# Patient Record
Sex: Male | Born: 1935 | Race: White | Hispanic: No | Marital: Married | Smoking: Never smoker
Health system: Southern US, Community
[De-identification: ages and names within clinical notes are randomized; demographics above are authoritative.]

## PROBLEM LIST (undated history)

## (undated) DIAGNOSIS — C4490 Unspecified malignant neoplasm of skin, unspecified: Secondary | ICD-10-CM

## (undated) DIAGNOSIS — I10 Essential (primary) hypertension: Secondary | ICD-10-CM

## (undated) HISTORY — DX: Unspecified malignant neoplasm of skin, unspecified: C44.90

## (undated) HISTORY — DX: Essential (primary) hypertension: I10

---

## 2016-06-22 DIAGNOSIS — B078 Other viral warts: Secondary | ICD-10-CM | POA: Diagnosis not present

## 2016-08-02 ENCOUNTER — Encounter: Payer: Self-pay | Admitting: Family Medicine

## 2016-08-02 ENCOUNTER — Ambulatory Visit (INDEPENDENT_AMBULATORY_CARE_PROVIDER_SITE_OTHER): Payer: Medicare HMO | Admitting: Family Medicine

## 2016-08-02 ENCOUNTER — Telehealth: Payer: Self-pay | Admitting: Family Medicine

## 2016-08-02 DIAGNOSIS — I10 Essential (primary) hypertension: Secondary | ICD-10-CM | POA: Diagnosis not present

## 2016-08-02 LAB — BASIC METABOLIC PANEL
BUN: 17 mg/dL (ref 6–23)
CALCIUM: 9.6 mg/dL (ref 8.4–10.5)
CO2: 28 meq/L (ref 19–32)
CREATININE: 1.01 mg/dL (ref 0.40–1.50)
Chloride: 102 mEq/L (ref 96–112)
GFR: 75.63 mL/min (ref >60.00)
Glucose, Bld: 87 mg/dL (ref 70–99)
Potassium: 4.5 mEq/L (ref 3.5–5.1)
Sodium: 136 mEq/L (ref 135–145)

## 2016-08-02 MED ORDER — OLMESARTAN MEDOXOMIL 20 MG PO TABS: 20.0000 mg | ORAL_TABLET | Freq: Every day | ORAL | 2 refills | Status: DC

## 2016-08-02 NOTE — Telephone Encounter (Signed)
° ° °  Pt ask if the below med can be written for 40mg  instead of 20mg  because he splits them in half   [place]

## 2016-08-02 NOTE — Progress Notes (Signed)
HPI:   Francis Park is a 81 y.o. male, who is here today with his wife to establish care.  Former PCP: Dr Twana First in [facility]. He jus move to [facility]. Last preventive routine visit: 12/2015.  Chronic medical problems: HTN and skin cancer (melanoma,BCC,and SCC) He is already established with dermatologists here in town and following every 6 months.    He lives with his wife. Independent ADL's and IADL's. No falls in the past year and denies depression symptoms.    Hypertension:   Dx around 1998.  Currently on Benicar 20 mg daily.   Last eye exam 01/2016. He is taking medications as instructed, no side effects reported.  He has not noted unusual headache, visual changes, exertional chest pain, dyspnea,  focal weakness, or edema.   Review of Systems  Constitutional: Negative for activity change, appetite change, fatigue, fever and unexpected weight change.  HENT: Negative for mouth sores, nosebleeds, sore throat and trouble swallowing.   Eyes: Negative for redness and visual disturbance.  Respiratory: Negative for apnea, cough, shortness of breath and wheezing.   Cardiovascular: Negative for chest pain, palpitations and leg swelling.  Gastrointestinal: Negative for abdominal pain, nausea and vomiting.  Endocrine: Negative for cold intolerance and heat intolerance.  Genitourinary: Negative for decreased urine volume, dysuria and hematuria.  Musculoskeletal: Negative for gait problem and myalgias.  Neurological: Negative for dizziness, syncope, weakness and headaches.  Psychiatric/Behavioral: Negative for confusion and sleep disturbance. The patient is not nervous/anxious.       No current outpatient prescriptions on file prior to visit.   No current facility-administered medications on file prior to visit.      Past Medical History:  Diagnosis Date  . Hypertension   . Skin cancer    Melanoma in situ, BCC,and SCC. Follows with dermatologists every 6  months.   No Known Allergies  Family History  Problem Relation Age of Onset  . Heart disease Father        CAD  . Hypertension Father   . Cancer Neg Hx     Social History   Social History  . Marital status: Married    Spouse name: N/A  . Number of children: N/A  . Years of education: N/A   Social History Main Topics  . Smoking status: Never Smoker  . Smokeless tobacco: Never Used  . Alcohol use No  . Drug use: No  . Sexual activity: Not Asked   Other Topics Concern  . None   Social History Narrative  . None    Vitals:   08/02/16 1404  BP: 138/80  Pulse: 85  Resp: 12   O2 sat at RA 97% Body mass index is 25.07 kg/m.  Physical Exam  Nursing note and vitals reviewed. Constitutional: He is oriented to person, place, and time. He appears well-developed and well-nourished. No distress.  HENT:  Head: Atraumatic.  Mouth/Throat: Oropharynx is clear and moist and mucous membranes are normal.  Eyes: Pupils are equal, round, and reactive to light. Conjunctivae and EOM are normal.  Neck: No tracheal deviation present. No thyroid mass and no thyromegaly present.  Cardiovascular: Normal rate and regular rhythm.   Occasional extrasystoles (X 2 in a min) are present.  No murmur heard. Pulses:      Dorsalis pedis pulses are 2+ on the right side, and 2+ on the left side.  Varicose vein LE, bilateral.   Respiratory: Effort normal and breath sounds normal. No respiratory distress.  GI:  Soft. He exhibits no mass. There is no hepatomegaly. There is no tenderness.  Musculoskeletal: He exhibits no edema.  Lymphadenopathy:    He has no cervical adenopathy.  Neurological: He is alert and oriented to person, place, and time. He has normal strength. Coordination and gait normal.  Skin: Skin is warm. No rash noted. No erythema.  Psychiatric: He has a normal mood and affect. Cognition and memory are normal.  Well groomed, good eye contact.    ASSESSMENT AND PLAN:   Mr.  Unknown Park was seen today for establish care.  Diagnoses and all orders for this visit:  Lab Results  Component Value Date   CREATININE 1.01 08/02/2016   BUN 17 08/02/2016   NA 136 08/02/2016   K 4.5 08/02/2016   CL 102 08/02/2016   CO2 28 08/02/2016    Hypertension, essential, benign  Adequately controlled. No changes in current management. DASH-low salt diet recommended. Eye exam recommended annually. F/U in 6 months, before if needed.   -     Basic metabolic panel -     olmesartan (BENICAR) 40 MG tablet; Take 0.5 tablets (20 mg total) by mouth daily.   He called later requesting changing Benicar to 40 mg to take 1/2 tab. Rx changed and sent.     Betty G. Martinique, MD  [facility] [place] office.

## 2016-08-02 NOTE — Patient Instructions (Addendum)
A few things to remember from today's visit:   Hypertension, essential, benign - Plan: olmesartan (BENICAR) 20 MG tablet, Basic metabolic panel  Blood pressure goal for most people is less than 140/90. Some populations (older than 60) the goal is less than 150/90.  Most recent cardiologists' recommendations recommend blood pressure at or less than 130/80.   Elevated blood pressure increases the risk of strokes, heart and kidney disease, and eye problems. Regular physical activity and a healthy diet (DASH diet) usually help. Low salt diet. Take medications as instructed.  Caution with some over the counter medications as cold medications, dietary products (for weight loss), and Ibuprofen or Aleve (frequent use);all these medications could cause elevation of blood pressure.   Please be sure medication list is accurate. If a new problem present, please set up appointment sooner than planned today.

## 2016-08-02 NOTE — Telephone Encounter (Signed)
Please advise regarding the Benicar.    Thanks!

## 2016-08-04 ENCOUNTER — Encounter: Payer: Self-pay | Admitting: Family Medicine

## 2016-08-04 MED ORDER — OLMESARTAN MEDOXOMIL 40 MG PO TABS: 20.0000 mg | ORAL_TABLET | Freq: Every day | ORAL | 1 refills | Status: DC

## 2016-08-06 NOTE — Telephone Encounter (Signed)
Rx for Benicar 40 mg to continue taking 1/2 tab was sent to his pharmacy. Thanks, BJ

## 2016-12-22 DIAGNOSIS — D225 Melanocytic nevi of trunk: Secondary | ICD-10-CM | POA: Diagnosis not present

## 2016-12-22 DIAGNOSIS — Z85820 Personal history of malignant melanoma of skin: Secondary | ICD-10-CM | POA: Diagnosis not present

## 2016-12-22 DIAGNOSIS — L814 Other melanin hyperpigmentation: Secondary | ICD-10-CM | POA: Diagnosis not present

## 2016-12-22 DIAGNOSIS — L578 Other skin changes due to chronic exposure to nonionizing radiation: Secondary | ICD-10-CM | POA: Diagnosis not present

## 2016-12-30 ENCOUNTER — Encounter: Payer: Self-pay | Admitting: Family Medicine

## 2017-02-06 ENCOUNTER — Ambulatory Visit (INDEPENDENT_AMBULATORY_CARE_PROVIDER_SITE_OTHER): Payer: Medicare HMO | Admitting: Family Medicine

## 2017-02-06 VITALS — BP 148/95 | HR 82 | Temp 97.9°F | Resp 12 | Ht 70.0 in | Wt 179.8 lb

## 2017-02-06 DIAGNOSIS — Z125 Encounter for screening for malignant neoplasm of prostate: Secondary | ICD-10-CM | POA: Diagnosis not present

## 2017-02-06 DIAGNOSIS — Z0000 Encounter for general adult medical examination without abnormal findings: Secondary | ICD-10-CM | POA: Diagnosis not present

## 2017-02-06 DIAGNOSIS — I10 Essential (primary) hypertension: Secondary | ICD-10-CM | POA: Diagnosis not present

## 2017-02-06 LAB — BASIC METABOLIC PANEL
BUN: 13 mg/dL (ref 6–23)
CO2: 27 mEq/L (ref 19–32)
Calcium: 10 mg/dL (ref 8.4–10.5)
Chloride: 101 mEq/L (ref 96–112)
Creatinine, Ser: 0.8 mg/dL (ref 0.40–1.50)
GFR: 98.85 mL/min (ref >60.00)
GLUCOSE: 104 mg/dL — AB (ref 70–99)
POTASSIUM: 4.5 meq/L (ref 3.5–5.1)
SODIUM: 137 meq/L (ref 135–145)

## 2017-02-06 LAB — LIPID PANEL
CHOLESTEROL: 151 mg/dL (ref 0–200)
HDL: 48.6 mg/dL (ref >39.00)
LDL Cholesterol: 86 mg/dL (ref 0–99)
NonHDL: 101.99
TRIGLYCERIDES: 82 mg/dL (ref 0.0–149.0)
Total CHOL/HDL Ratio: 3
VLDL: 16.4 mg/dL (ref 0.0–40.0)

## 2017-02-06 LAB — PSA: PSA: 0.93 ng/mL (ref 0.10–4.00)

## 2017-02-06 MED ORDER — OLMESARTAN MEDOXOMIL 40 MG PO TABS: 20.0000 mg | ORAL_TABLET | Freq: Every day | ORAL | 1 refills | Status: DC

## 2017-02-06 NOTE — Progress Notes (Signed)
HPI:  [name] is a 82 y.o.male here today for his routine Medicare preventive visit.  Regular exercise 3 or more times per week: Walking daily with his wife. Following a healthy diet: Yes.  Functional Status Survey: Is the patient deaf or have difficulty hearing?: No Does the patient have difficulty seeing, even when wearing glasses/contacts?: No Does the patient have difficulty concentrating, remembering, or making decisions?: No Does the patient have difficulty walking or climbing stairs?: No Does the patient have difficulty dressing or bathing?: No Does the patient have difficulty doing errands alone such as visiting a doctor's office or shopping?: No  Fall Risk  02/06/2017  Falls in the past year? Yes  Number falls in past yr: 1  Injury with Fall? No    Depression screen [facility] 2/9 02/06/2017  Decreased Interest 0  Down, Depressed, Hopeless 0  PHQ - 2 Score 0    Mini-Cog - 02/06/17 0957    Normal clock drawing test?  yes    How many words correct?  3         Hearing Screening   Method: Audiometry   125Hz  250Hz  500Hz  1000Hz  2000Hz  3000Hz  4000Hz  6000Hz  8000Hz   Right ear:   Pass Pass Pass  Pass    Left ear:   Pass Pass Pass  Pass      Visual Acuity Screening   Right eye Left eye Both eyes  Without correction: 20/40 20/40   With correction:        There is no immunization history on file for this patient.   Review of Systems  Constitutional: Negative for activity change, appetite change, fatigue, fever and unexpected weight change.  HENT: Negative for dental problem, nosebleeds, sore throat, trouble swallowing and voice change.   Eyes: Negative for redness and visual disturbance.  Respiratory: Negative for apnea, cough, shortness of breath and wheezing.   Cardiovascular: Negative for chest pain, palpitations and leg swelling.  Gastrointestinal: Negative for abdominal pain, blood in stool, nausea and vomiting.       No changes in bowel  habits.  Endocrine: Negative for cold intolerance, heat intolerance, polydipsia, polyphagia and polyuria.  Genitourinary: Negative for decreased urine volume, dysuria, genital sores, hematuria and testicular pain.  Musculoskeletal: Negative for gait problem and myalgias.  Skin: Negative for color change and rash.  Allergic/Immunologic: Negative for environmental allergies.  Neurological: Negative for dizziness, seizures, syncope, weakness, numbness and headaches.  Hematological: Negative for adenopathy. Does not bruise/bleed easily.  Psychiatric/Behavioral: Negative for confusion and sleep disturbance. The patient is not nervous/anxious.   All other systems reviewed and are negative.  Current Outpatient Medications on File Prior to Visit  Medication Sig Dispense Refill  . Multiple Vitamin (MULTIVITAMIN) tablet Take 1 tablet by mouth daily.     No current facility-administered medications on file prior to visit.      Past Medical History:  Diagnosis Date  . Hypertension   . Skin cancer    Melanoma in situ, BCC,and SCC. Follows with dermatologists every 6 months.    History reviewed. No pertinent surgical history.  No Known Allergies  Family History  Problem Relation Age of Onset  . Heart disease Father        CAD  . Hypertension Father   . Cancer Neg Hx     Social History   Socioeconomic History  . Marital status: Married    Spouse name: None  . Number of children: None  . Years of education: None  .  Highest education level: None  Social Needs  . Financial resource strain: None  . Food insecurity - worry: None  . Food insecurity - inability: None  . Transportation needs - medical: None  . Transportation needs - non-medical: None  Occupational History  . None  Tobacco Use  . Smoking status: Never Smoker  . Smokeless tobacco: Never Used  Substance and Sexual Activity  . Alcohol use: No  . Drug use: No  . Sexual activity: None  Other Topics Concern  . None    Social History Narrative  . None     Vitals:   02/06/17 0907 02/06/17 1012  BP: (!) 170/100 (!) 148/95  Pulse: 82   Resp: 12   Temp: 97.9 F (36.6 C)   SpO2: 98%    Body mass index is 25.8 kg/m.   Wt Readings from Last 3 Encounters:  02/06/17 179 lb 12.8 oz (81.6 kg)  08/02/16 176 lb (79.8 kg)    Physical Exam   Nursing note and vitals reviewed. Constitutional: He is oriented to person, place, and time. He appears well-developed. No distress.  HENT:  Head: Atraumatic.  Right Ear: Hearing, tympanic membrane, external ear and ear canal normal.  Left Ear: Hearing, tympanic membrane, external ear and ear canal normal.  Mouth/Throat: Oropharynx is clear and moist and mucous membranes are normal.  Eyes: Conjunctivae and EOM are normal. Pupils are equal, round, and reactive to light.  Neck: Normal range of motion. No tracheal deviation present. No thyromegaly present.  Cardiovascular: Normal rate and regular rhythm.  No murmur heard. Pulses:      Dorsalis pedis pulses are 2+ on the right side, and 2+ on the left side.  Respiratory: Effort normal and breath sounds normal. No respiratory distress.  GI: Soft. He exhibits no mass. There is no tenderness. No hepatosplenomegaly. Genitourinary:  Refused,no concerns.  Musculoskeletal: He exhibits no edema or tenderness.  No major deformities appreciated and no signs of synovitis.  Lymphadenopathy:    He has no cervical adenopathy.       Right: No supraclavicular adenopathy present.       Left: No supraclavicular adenopathy present.  Neurological: He is alert and oriented to person, place, and time. He has normal strength. No cranial nerve deficit or sensory deficit. Coordination and gait normal.  Reflex Scores:      Bicep reflexes are 2+ on the right side and 2+ on the left side.      Patellar reflexes are 2+ on the right side and 2+ on the left side. Skin: Skin is warm. No erythema.  Psychiatric: He has a normal mood and  affect. Well groomed,good eye contact.    ASSESSMENT AND PLAN:    Francis Park was seen today for annual exam.  Diagnoses and all orders for this visit:  Medicare annual wellness visit, subsequent  Routine general medical examination at a health care facility  Hypertension, essential, benign -     olmesartan (BENICAR) 40 MG tablet; Take 0.5 tablets (20 mg total) by mouth daily. -     Basic metabolic panel -     Lipid panel  Prostate cancer screening -     PSA(Must document that pt has been informed of limitations of PSA testing.)   He has POA and living will.      Return in 6 months (on 08/06/2017) for HTN.      Aarian Cleaver G. Martinique, MD  [facility]. [place] office.

## 2017-02-06 NOTE — Patient Instructions (Addendum)
A few things to remember from today's visit:   Prostate cancer screening - Plan: PSA(Must document that pt has been informed of limitations of PSA testing.)  Hypertension, essential, benign - Plan: olmesartan (BENICAR) 40 MG tablet, Basic metabolic panel, Lipid panel  Routine general medical examination at a health care facility   A few tips:  -As we age balance is not as good as it was, so there is a higher risks for falls. Please remove small rugs and furniture that is "in your way" and could increase the risk of falls. Stretching exercises may help with fall prevention: Yoga and Tai Chi are some examples. Low impact exercise is better, so you are not very achy the next day.  -Sun screen and avoidance of direct sun light recommended. Caution with dehydration, if working outdoors be sure to drink enough fluids.  - Some medications are not safe as we age, increases the risk of side effects and can potentially interact with other medication you are also taken;  including some of over the counter medications. Be sure to let me know when you start a new medication even if it is a dietary/vitamin supplement.   -Healthy diet low in red meet/animal fat and sugar + regular physical activity is recommended.       Screening schedule for the next 5-10 years:  Glaucoma screening/eye exam every 1-2 years.  Flu vaccine annually.  Fall prevention   Abdominal aortic aneurysm screening if you ever smoke.    Advance directives:  Please see a lawyer and/or go to this website to help you with advanced directives and designating a health care power of attorney so that your wishes will be followed should you become too ill to make your own medical decisions.  http://www.ncdhhs.gov/aging/direct.htm  Please be sure medication list is accurate. If a new problem present, please set up appointment sooner than planned today.        

## 2017-02-12 ENCOUNTER — Encounter: Payer: Self-pay | Admitting: Family Medicine

## 2017-02-12 NOTE — Progress Notes (Signed)
HPI:  [name] is a 82 y.o.male here today for his routine physical examination.  Last CPE: 12/2015   Regular exercise 3 or more times per week: Walking his dog daily. Following a healthy diet: Yes.   Chronic medical problems: HTN,skin cancer (Melanoma,BCC,and SCC).  Home BP's 130's/70's. States that BP's are usually elevated during OV's. Denies severe/frequent headache, visual changes, chest pain, dyspnea, palpitation, claudication, focal weakness, or edema.  He lives with his wife.. Independent ADL's and IADL's. He felt on ice about 2 weeks ago,unharmed.   Functional Status Survey: Is the patient deaf or have difficulty hearing?: No Does the patient have difficulty seeing, even when wearing glasses/contacts?: No Does the patient have difficulty concentrating, remembering, or making decisions?: No Does the patient have difficulty walking or climbing stairs?: No Does the patient have difficulty dressing or bathing?: No Does the patient have difficulty doing errands alone such as visiting a doctor's office or shopping?: No  Fall Risk  02/06/2017  Falls in the past year? Yes  Number falls in past yr: 1  Injury with Fall? No     Providers he sees regularly:  Eye care provider: Not yet established with one in town Last eye exam a year ago in [place]. Dermatologist,Dr Allyson Sabal.   Depression screen [facility] 2/9 02/06/2017  Decreased Interest 0  Down, Depressed, Hopeless 0  PHQ - 2 Score 0    Mini-Cog - 02/06/17 0957    Normal clock drawing test?  yes    How many words correct?  3         Hearing Screening   Method: Audiometry   125Hz  250Hz  500Hz  1000Hz  2000Hz  3000Hz  4000Hz  6000Hz  8000Hz   Right ear:   Pass Pass Pass  Pass    Left ear:   Pass Pass Pass  Pass        There is no immunization history on file for this patient.   Colonoscopy 4 years ago,negative and per pt report no furher screening was recommended. Prostate cancer screening 1-2  years ago. Nocturia x 1,stable. Denies dysuria,increased urinary frequency, gross hematuria,or decreased urine output. -Denies high alcohol intake, tobacco use, or Hx of illicit drug use.  -Concerns and/or follow up today:   Hypertension, today BP is elevated, she is reporting normal BP readings at home, 130s/70s. Denies headache, visual changes, chest pain, dyspnea, palpitation, claudication, focal weakness, or edema.  He reports history of elevated BP during office visits. Currently he is on Benicar 20 mg 1/2 tab daily.  Review of Systems  Constitutional: Negative for activity change, appetite change, fatigue, fever and unexpected weight change.  HENT: Negative for dental problem, nosebleeds, sore throat, trouble swallowing and voice change.   Eyes: Negative for redness and visual disturbance.  Respiratory: Negative for apnea, cough, shortness of breath and wheezing.   Cardiovascular: Negative for chest pain, palpitations and leg swelling.  Gastrointestinal: Negative for abdominal pain, blood in stool, nausea and vomiting.       No changes in bowel habits.  Endocrine: Negative for cold intolerance, heat intolerance, polydipsia, polyphagia and polyuria.  Genitourinary: Negative for decreased urine volume, dysuria, genital sores, hematuria and testicular pain.  Musculoskeletal: Negative for gait problem and myalgias.  Skin: Negative for color change and rash.  Allergic/Immunologic: Negative for environmental allergies.  Neurological: Negative for dizziness, seizures, syncope, weakness, numbness and headaches.  Hematological: Negative for adenopathy. Does not bruise/bleed easily.  Psychiatric/Behavioral: Negative for confusion and sleep disturbance. The patient is not nervous/anxious.  All other systems reviewed and are negative.  Current Outpatient Medications on File Prior to Visit  Medication Sig Dispense Refill  . Multiple Vitamin (MULTIVITAMIN) tablet Take 1 tablet by mouth  daily.     No current facility-administered medications on file prior to visit.      Past Medical History:  Diagnosis Date  . Hypertension   . Skin cancer    Melanoma in situ, BCC,and SCC. Follows with dermatologists every 6 months.    No Known Allergies  Family History  Problem Relation Age of Onset  . Heart disease Father        CAD  . Hypertension Father   . Cancer Neg Hx     Social History   Socioeconomic History  . Marital status: Married    Spouse name: Not on file  . Number of children: Not on file  . Years of education: Not on file  . Highest education level: Not on file  Social Needs  . Financial resource strain: Not on file  . Food insecurity - worry: Not on file  . Food insecurity - inability: Not on file  . Transportation needs - medical: Not on file  . Transportation needs - non-medical: Not on file  Occupational History  . Not on file  Tobacco Use  . Smoking status: Never Smoker  . Smokeless tobacco: Never Used  Substance and Sexual Activity  . Alcohol use: No  . Drug use: No  . Sexual activity: Not on file  Other Topics Concern  . Not on file  Social History Narrative  . Not on file     Vitals:   02/06/17 0907 02/06/17 1012  BP: (!) 170/100 (!) 148/95  Pulse: Resp: 82 12   Temp: 97.9 F (36.6 C)   SpO2: 98%    Body mass index is 25.8 kg/m.   Wt Readings from Last 3 Encounters:  02/06/17 179 lb 12.8 oz (81.6 kg)  08/02/16 176 lb (79.8 kg)    Physical Exam   Nursing note and vitals reviewed. Constitutional: He is oriented to person, place, and time. He appears well-developed. No distress.  HENT:  Head: Atraumatic.  Right Ear: Hearing, tympanic membrane, external ear and ear canal normal.  Left Ear: Hearing, tympanic membrane, external ear and ear canal normal.  Mouth/Throat: Oropharynx is clear and moist and mucous membranes are normal.  Eyes: Conjunctivae and EOM are normal. Pupils are equal, round, and reactive to light.    Neck: Normal range of motion. No tracheal deviation present. No thyromegaly present.  Cardiovascular: Normal rate and regular rhythm.  No murmur heard. Pulses:      Dorsalis pedis pulses are 2+ on the right side, and 2+ on the left side.  Respiratory: Effort normal and breath sounds normal. No respiratory distress.  GI: Soft. He exhibits no mass. There is no tenderness. No hepatosplenomegaly. Genitourinary: Refused,no concerns.  Musculoskeletal: He exhibits no edema or tenderness.  No major deformities appreciated and no signs of synovitis.  Lymphadenopathy:    He has no cervical adenopathy.       Right: No supraclavicular adenopathy present.       Left: No supraclavicular adenopathy present.  Neurological: He is alert and oriented to person, place, and time. He has normal strength. No cranial nerve deficit or sensory deficit. Coordination and gait normal.  Reflex Scores:      Bicep reflexes are 2+ on the right side and 2+ on the left side.      Patellar  reflexes are 2+ on the right side and 2+ on the left side. Skin: Skin is warm. No erythema.  Psychiatric: He has a normal mood and affect. Well groomed,good eye contact.    ASSESSMENT AND PLAN:   Mr. [name] was seen today for annual exam and Medicare preventive visit..  Diagnoses and all orders for this visit:  Lab Results  Component Value Date   CHOL 151 02/06/2017   HDL 48.60 02/06/2017   LDLCALC 86 02/06/2017   TRIG 82.0 02/06/2017   CHOLHDL 3 02/06/2017   Lab Results  Component Value Date   PSA 0.93 02/06/2017   Lab Results  Component Value Date   CREATININE 0.80 02/06/2017   BUN 13 02/06/2017   NA 137 02/06/2017   K 4.5 02/06/2017   CL 101 02/06/2017   CO2 27 02/06/2017     Medicare annual wellness visit, subsequent  We discussed the importance of staying active, physically and mentally, as well as the benefits of a healthy/balnace diet. Low impact exercise that involve stretching and strengthing are  ideal. Vaccines: He does not recall having Zoster vaccine and not interested. Not sure about pneumonia vaccines,not interested. Influenza vaccine reported a few weeks ago. We discussed preventive screening for the next 5-10 years, summery of recommendations given in AVS:  Eye exam and glaucoma screening ever 1-2 years. Diabetes screening annually. Fall prevention.  Continue following with dermatologist.  Advance directives and end of life discussed, he has POA and living will.  Routine general medical examination at a health care facility  Preventing guidelines recommendations discussed.  Prostate cancer screening -     PSA(Must document that pt has been informed of limitations of PSA testing.)  Hypertension, essential, benign  Adequately controlled based on reported BP numbers. Here in the office elevated. Educated about the possible complications of poorly controlled hypertension. Continue monitoring BP at home. No changes in current management. DASH diet recommended. Eye exam recommended annually. F/U in 6 months, before if needed.  -     olmesartan (BENICAR) 40 MG tablet; Take 0.5 tablets (20 mg total) by mouth daily. -     Basic metabolic panel -     Lipid panel     Return in 6 months (on 08/06/2017) for HTN.      Satya Bohall G. Martinique, MD  [facility]. [place] office.

## 2017-03-02 ENCOUNTER — Ambulatory Visit: Payer: Self-pay | Admitting: *Deleted

## 2017-03-02 NOTE — Telephone Encounter (Signed)
  Reason for Disposition . Blood in urine  (Exception: could be normal menstrual bleeding)  Answer Assessment - Initial Assessment Questions 1. COLOR of URINE: "Describe the color of the urine."  (e.g., tea-colored, pink, red, blood clots, bloody)     Did not see blood in urine, just in his underwear 2. ONSET: "When did the bleeding start?"      Yesterday afternoon 3. EPISODES: "How many times has there been blood in the urine?" or "How many times today?"     n/a 4. PAIN with URINATION: "Is there any pain with passing your urine?" If so, ask: "How bad is the pain?"  (Scale 1-10; or mild, moderate, severe)    - MILD - complains slightly about urination hurting    - MODERATE - interferes with normal activities      - SEVERE - excruciating, unwilling or unable to urinate because of the pain      no 5. FEVER: "Do you have a fever?" If so, ask: "What is your temperature, how was it measured, and when did it start?"     no 6. ASSOCIATED SYMPTOMS: "Are you passing urine more frequently than usual?"     no 7. OTHER SYMPTOMS: "Do you have any other symptoms?" (e.g., back/flank pain, abdominal pain, vomiting)     no 8. PREGNANCY: "Is there any chance you are pregnant?" "When was your last menstrual period?"     n/a  Protocols used: URINE - BLOOD IN-A-AH

## 2017-03-02 NOTE — Telephone Encounter (Signed)
Pt stated yesterday afternoon, he noticed a couple drops of blood in his underwear. He denies having any blood in his urine, or pain. He is not having any trouble voiding or any kidney stones. He states he has an enlarged prostate. Appointment scheduled for Friday with his pcp, per pt request. Pt advised to call back for any concerns, pain in lower back or abd, or elevated fever. Pt voiced understanding.

## 2017-03-03 ENCOUNTER — Encounter: Payer: Self-pay | Admitting: Family Medicine

## 2017-03-03 ENCOUNTER — Ambulatory Visit (INDEPENDENT_AMBULATORY_CARE_PROVIDER_SITE_OTHER): Payer: Medicare HMO | Admitting: Family Medicine

## 2017-03-03 VITALS — BP 127/88 | HR 73 | Temp 97.8°F | Resp 16 | Ht 70.0 in | Wt 183.1 lb

## 2017-03-03 DIAGNOSIS — R319 Hematuria, unspecified: Secondary | ICD-10-CM

## 2017-03-03 LAB — POC URINALSYSI DIPSTICK (AUTOMATED)
Bilirubin, UA: NEGATIVE
Glucose, UA: NEGATIVE
Ketones, UA: NEGATIVE
Leukocytes, UA: NEGATIVE
Nitrite, UA: NEGATIVE
PROTEIN UA: NEGATIVE
RBC UA: NEGATIVE
SPEC GRAV UA: 1.025 (ref 1.010–1.025)
UROBILINOGEN UA: 0.2 U/dL
pH, UA: 6 (ref 5.0–8.0)

## 2017-03-03 NOTE — Progress Notes (Signed)
ACUTE VISIT   HPI:  Chief Complaint  Patient presents with  . Spot of blood in underwear    on Tuesday patient saw a small spot of blood in underwear, but not in his urine    Francis Park is a 82 y.o. male, who is here today complaining of one time episode of pinkish drop noted on front of underwear 3 days ago.  Denies dysuria,increased urinary frequency, gross hematuria,or decreased urine output. Nocturia x 1, stable for years. He denies risk factors for STDs. He has no noted urethral discharge,pruritus,or erythema. . He has not noted skin lesion and denies trauma.   Lab Results  Component Value Date   PSA 0.93 02/06/2017   No Hx of tobacco use. He has not noted fever,chills,abdominal pain,or abnormal wt loss.   Denies abdominal pain, nausea, vomiting, changes in bowel habits, blood in stool or melena.  He does not take Aspirin or NSAID's.  Review of Systems  Constitutional: Negative for appetite change, fatigue and fever.  HENT: Negative for mouth sores, nosebleeds and trouble swallowing.   Respiratory: Negative for cough, shortness of breath and wheezing.   Cardiovascular: Negative for chest pain, palpitations and leg swelling.  Gastrointestinal: Negative for abdominal pain, blood in stool, nausea and vomiting.       No changes in bowel habits.  Genitourinary: Negative for decreased urine volume, discharge, dysuria, flank pain, hematuria, scrotal swelling and urgency.  Musculoskeletal: Negative for back pain and myalgias.  Skin: Negative for rash.  Neurological: Negative for weakness and headaches.  Hematological: Negative for adenopathy.  Psychiatric/Behavioral: Negative for confusion.      Current Outpatient Medications on File Prior to Visit  Medication Sig Dispense Refill  . Multiple Vitamin (MULTIVITAMIN) tablet Take 1 tablet by mouth daily.    Marland Kitchen olmesartan (BENICAR) 40 MG tablet Take 0.5 tablets (20 mg total) by mouth daily. 90 tablet 1    No current facility-administered medications on file prior to visit.      Past Medical History:  Diagnosis Date  . Hypertension   . Skin cancer    Melanoma in situ, BCC,and SCC. Follows with dermatologists every 6 months.   No Known Allergies  Social History   Socioeconomic History  . Marital status: Married    Spouse name: None  . Number of children: None  . Years of education: None  . Highest education level: None  Social Needs  . Financial resource strain: None  . Food insecurity - worry: None  . Food insecurity - inability: None  . Transportation needs - medical: None  . Transportation needs - non-medical: None  Occupational History  . None  Tobacco Use  . Smoking status: Never Smoker  . Smokeless tobacco: Never Used  Substance and Sexual Activity  . Alcohol use: No  . Drug use: No  . Sexual activity: None  Other Topics Concern  . None  Social History Narrative  . None    Vitals:   03/03/17 1031  BP: 127/88  Pulse: 73  Resp: 16  Temp: 97.8 F (36.6 C)  SpO2: 97%   Body mass index is 26.28 kg/m.   Physical Exam  Nursing note and vitals reviewed. Constitutional: He is oriented to person, place, and time. He appears well-developed and well-nourished. No distress.  HENT:  Head: Normocephalic and atraumatic.  Eyes: Conjunctivae and EOM are normal.  Cardiovascular: Normal rate and regular rhythm.  No murmur heard. Respiratory: Effort normal and breath sounds normal.  No respiratory distress.  GI: Soft. He exhibits no mass. There is no hepatomegaly. There is no tenderness. There is no CVA tenderness.  Genitourinary: Penis normal. Right testis shows no mass, no swelling and no tenderness. Left testis shows no mass, no swelling and no tenderness. No penile erythema. No discharge found.  Musculoskeletal: He exhibits no edema or tenderness.  Lymphadenopathy:       Right: No inguinal adenopathy present.       Left: No inguinal adenopathy present.    Neurological: He is alert and oriented to person, place, and time. He has normal strength. Gait normal.  Skin: Skin is warm. No rash noted. No erythema.  Psychiatric: He has a normal mood and affect.  Well groomed,good eye contact.    ASSESSMENT AND PLAN:   Francis was seen today for spot of blood in underwear.  Diagnoses and all orders for this visit:  Hematuria, unspecified type -     POCT Urinalysis Dipstick (Automated)   We discussed possible etiologies, local irritation,injury to more serious process,malignancies. One single episode of what seemed to be blood on underwear,no urinary symptoms. Urine dipstick negative. He left before we could discussed plan and urine results.  Options: Monitoring and repeating U/A in 3 months OR urology referral,we will contact pt.   -Francis Park was advised to seek immediate medical attention if sudden worsening symptoms.      Yuko Coventry G. Martinique, MD  [facility] [place] office.

## 2017-03-04 ENCOUNTER — Encounter: Payer: Self-pay | Admitting: Family Medicine

## 2017-03-04 NOTE — Progress Notes (Signed)
Urine done during OV was negative for blood. We could repeat urine in 3 months (U/A with reflex microscopic) and monitor for new /recurrent symptoms OR urology referral.  Thanks

## 2017-04-21 ENCOUNTER — Other Ambulatory Visit: Payer: Self-pay | Admitting: *Deleted

## 2017-04-21 ENCOUNTER — Telehealth: Payer: Self-pay | Admitting: Family Medicine

## 2017-04-21 MED ORDER — LOSARTAN POTASSIUM 50 MG PO TABS: 50.0000 mg | ORAL_TABLET | Freq: Every day | ORAL | 3 refills | Status: DC

## 2017-04-21 NOTE — Telephone Encounter (Signed)
Message sent to Dr. Jordan for review and approval. 

## 2017-04-21 NOTE — Telephone Encounter (Signed)
Copied from [place] [id number]. Topic: Quick Communication - Rx Refill/Question >> Apr 21, 2017  9:12 AM Scherrie Gerlach wrote: Medication: olmesartan (BENICAR) 40 MG tablet Has the patient contacted their pharmacy? Yes   Pt states he has tried 6 different pharmacies and NO ONE has this medication. Pt needs an alternate med to replace this sent to [facility] # [address], [place] [phone] (Phone) [phone] (Fax)

## 2017-04-21 NOTE — Telephone Encounter (Signed)
Spoke with patient and he decided to change to Cozaar 50 mg. Rx sent to pharmacy.

## 2017-04-21 NOTE — Telephone Encounter (Signed)
Cozaar 50 mg , Avapro 150 mg , or Diovan 160 mg. He needs to monitor BP closely after changing medication. Thanks, BJ

## 2017-06-14 DIAGNOSIS — L570 Actinic keratosis: Secondary | ICD-10-CM | POA: Diagnosis not present

## 2017-06-14 DIAGNOSIS — D229 Melanocytic nevi, unspecified: Secondary | ICD-10-CM | POA: Diagnosis not present

## 2017-06-14 DIAGNOSIS — L821 Other seborrheic keratosis: Secondary | ICD-10-CM | POA: Diagnosis not present

## 2017-06-14 DIAGNOSIS — Z85820 Personal history of malignant melanoma of skin: Secondary | ICD-10-CM | POA: Diagnosis not present

## 2017-06-14 DIAGNOSIS — D1801 Hemangioma of skin and subcutaneous tissue: Secondary | ICD-10-CM | POA: Diagnosis not present

## 2017-09-20 ENCOUNTER — Telehealth: Payer: Self-pay | Admitting: Family Medicine

## 2017-09-20 DIAGNOSIS — L309 Dermatitis, unspecified: Secondary | ICD-10-CM | POA: Diagnosis not present

## 2017-09-20 DIAGNOSIS — L570 Actinic keratosis: Secondary | ICD-10-CM | POA: Diagnosis not present

## 2017-09-20 NOTE — Telephone Encounter (Unsigned)
Copied from [place] [id number]. Topic: Quick Communication - Rx Refill/Question >> Sep 20, 2017 10:39 AM Scherrie Gerlach wrote: Medication: Benicar 40  Pt having Sherise at dr Allyson Sabal office to call to see if we can switch him back to benicar from the losartan (COZAAR) 50 MG tablet [facility] # [address] [place] [phone] (Phone) [phone] (Fax)

## 2017-09-20 NOTE — Telephone Encounter (Signed)
Message sent to Dr. Jordan for review and approval. 

## 2017-09-22 ENCOUNTER — Other Ambulatory Visit: Payer: Self-pay | Admitting: *Deleted

## 2017-09-22 ENCOUNTER — Telehealth: Payer: Self-pay | Admitting: Family Medicine

## 2017-09-22 MED ORDER — OLMESARTAN MEDOXOMIL 20 MG PO TABS: 20.0000 mg | ORAL_TABLET | Freq: Every day | ORAL | 2 refills | Status: DC

## 2017-09-22 NOTE — Telephone Encounter (Signed)
Cozaar d/c and Benicar 20 mg, one tablet by mouth daily sent to pharmacy.

## 2017-09-22 NOTE — Telephone Encounter (Signed)
It is okay to change back to Benicar, same dose as he was taking and discontinuing Cozaar. Continue monitoring BP. Thanks, BJ

## 2017-09-22 NOTE — Telephone Encounter (Signed)
Copied from [place] [id number]. Topic: Quick Communication - Rx Refill/Question >> Sep 22, 2017 11:21 AM Janace Aris A wrote: Medication: benicar  Has the patient contacted their pharmacy? Yes  (Agent: If yes, when and what did the pharmacy advise? RX has not been received as of yet.   Preferred Pharmacy (with phone number or street name): [facility] # [address] [place] [phone] (Phone) [phone] (Fax)   Call back number - [phone]

## 2017-09-22 NOTE — Telephone Encounter (Signed)
Copied from [place] [id number]. Topic: Quick Communication - Rx Refill/Question >> Sep 22, 2017 11:21 AM Janace Aris A wrote: Medication: benicar  Has the patient contacted their pharmacy? Yes  (Agent: If yes, when and what did the pharmacy advise? RX has not been received as of yet.   Preferred Pharmacy (with phone number or street name): [facility] # [address] [place] [phone] (Phone) [phone] (Fax)   Call back number - [phone]

## 2017-09-22 NOTE — Telephone Encounter (Signed)
Please see TE from 09/20/2017 from Dr. Martinique to [place].  This is already being taken care of.  NT will close this request to prevent duplication.

## 2017-10-04 ENCOUNTER — Ambulatory Visit (INDEPENDENT_AMBULATORY_CARE_PROVIDER_SITE_OTHER): Payer: Medicare HMO | Admitting: Family Medicine

## 2017-10-04 ENCOUNTER — Encounter: Payer: Self-pay | Admitting: Family Medicine

## 2017-10-04 VITALS — BP 126/70 | HR 92 | Temp 98.1°F | Resp 16 | Ht 70.0 in | Wt 172.5 lb

## 2017-10-04 DIAGNOSIS — R21 Rash and other nonspecific skin eruption: Secondary | ICD-10-CM | POA: Diagnosis not present

## 2017-10-04 MED ORDER — METHYLPREDNISOLONE ACETATE 40 MG/ML IJ SUSP
40.0000 mg | Freq: Once | INTRAMUSCULAR | Status: AC
  Administered 2017-10-04: 40 mg via INTRAMUSCULAR

## 2017-10-04 MED ORDER — PREDNISONE 20 MG PO TABS
ORAL_TABLET | ORAL | 0 refills | Status: DC
Start: 2017-10-04 — End: 2018-03-07

## 2017-10-04 NOTE — Progress Notes (Signed)
ACUTE VISIT   HPI:  Chief Complaint  Patient presents with  . Rash    on back and sides that started 2 weeks, not getting any better    Mr.Rolland E Mudrick is a 82 y.o. male, who is here today complaining of 2 weeks of persistent erythematosus rash on abdomen, upper chest, and back.  He already followed with dermatologist, it was thought to be related to Losartan, which he discontinued about 2 weeks ago.  Rash has not improved for the contrary new lesions are appearing. Rash is mildly pruritic.  First lesion was on left side,under waist,already fading. Negative for new medication, detergent, soap, or body product. No known insect bite or outdoor exposures to plants. No sick contact. No Hx of eczema or similar rash in the past.  OTC medication for this problem: None  He denies oral lesions/edema,cough, wheezing, dyspnea, abdominal pain, nausea, or vomiting.   Review of Systems  Constitutional: Negative for appetite change, fatigue and fever.  HENT: Negative for mouth sores and sore throat.   Respiratory: Negative for cough, shortness of breath and wheezing.   Gastrointestinal: Negative for abdominal pain, nausea and vomiting.  Genitourinary: Negative for decreased urine volume and hematuria.  Musculoskeletal: Negative for joint swelling and myalgias.  Skin: Positive for rash. Negative for wound.  Allergic/Immunologic: Positive for environmental allergies.      Current Outpatient Medications on File Prior to Visit  Medication Sig Dispense Refill  . Multiple Vitamin (MULTIVITAMIN) tablet Take 1 tablet by mouth daily.    Marland Kitchen olmesartan (BENICAR) 20 MG tablet Take 1 tablet (20 mg total) by mouth daily. 90 tablet 2   No current facility-administered medications on file prior to visit.      Past Medical History:  Diagnosis Date  . Hypertension   . Skin cancer    Melanoma in situ, BCC,and SCC. Follows with dermatologists every 6 months.   No Known  Allergies  Social History   Socioeconomic History  . Marital status: Married    Spouse name: Not on file  . Number of children: Not on file  . Years of education: Not on file  . Highest education level: Not on file  Occupational History  . Not on file  Social Needs  . Financial resource strain: Not on file  . Food insecurity:    Worry: Not on file    Inability: Not on file  . Transportation needs:    Medical: Not on file    Non-medical: Not on file  Tobacco Use  . Smoking status: Never Smoker  . Smokeless tobacco: Never Used  Substance and Sexual Activity  . Alcohol use: No  . Drug use: No  . Sexual activity: Not on file  Lifestyle  . Physical activity:    Days per week: Not on file    Minutes per session: Not on file  . Stress: Not on file  Relationships  . Social connections:    Talks on phone: Not on file    Gets together: Not on file    Attends religious service: Not on file    Active member of club or organization: Not on file    Attends meetings of clubs or organizations: Not on file    Relationship status: Not on file  Other Topics Concern  . Not on file  Social History Narrative  . Not on file    Vitals:   10/04/17 1515  BP: 126/70  Pulse: 92  Resp: 16  Temp: 98.1 F (36.7 C)  SpO2: 96%   Body mass index is 24.75 kg/m.   Physical Exam  Nursing note and vitals reviewed. Constitutional: He is oriented to person, place, and time. He appears well-developed. No distress.  HENT:  Head: Normocephalic and atraumatic.  Mouth/Throat: Oropharynx is clear and moist and mucous membranes are normal.  Eyes: Conjunctivae are normal.  Cardiovascular: Normal rate and regular rhythm.  Respiratory: Effort normal and breath sounds normal. No respiratory distress.  Musculoskeletal: He exhibits no edema.  Lymphadenopathy:    He has no cervical adenopathy.  Neurological: He is alert and oriented to person, place, and time. He has normal strength. Gait normal.   Skin: Skin is warm. Rash noted. Rash is macular. No erythema.  Macular lesions with defined borders, 1.5-2.5 cm in size, mildly scaly noted on abdomen, upper chest, and back. Lesions do not have a clear center. No tender or indurated.  He has other chronic hyperpigmented raised lesions (SK).  See pictures.  Psychiatric: He has a normal mood and affect.  Well groomed, good eye contact.          ASSESSMENT AND PLAN:     [name] was seen today for rash.  Diagnoses and all orders for this visit:  Erythematous rash -     methylPREDNISolone acetate (DEPO-MEDROL) injection 40 mg -     predniSONE (DELTASONE) 20 MG tablet; 3 tabs for 2 days, 2 tabs for 3 days, 1 tabs for 3 days, and 1/2 tab for 3 days. Take tables together with breakfast.   We discussed possible etiologies. Rash has not improved after discontinuing losartan. After discussion of some side effects, he agrees with trying prednisone taper.  He has taken medication in the past and it has been well-tolerated. Here in the office after verbal consent he received Depo-Medrol 40 mg IM, he was instructed to start prednisone tomorrow with breakfast. Instructed about warning signs. He will arrange appointment with his dermatologist if rash is persistent,he may need Bx.     Return if symptoms worsen or fail to improve.     Griffen Frayne G. Martinique, MD  [facility]. [place] office.

## 2017-10-04 NOTE — Patient Instructions (Signed)
A few things to remember from today's visit:   Erythematous rash - Plan: methylPREDNISolone acetate (DEPO-MEDROL) injection 40 mg, predniSONE (DELTASONE) 20 MG tablet  Tomorrow you are going to start prednisone, please take it with breakfast. If rash does not improve, please follow-up with dermatologist, you may need a biopsy.

## 2017-12-18 DIAGNOSIS — Z85820 Personal history of malignant melanoma of skin: Secondary | ICD-10-CM | POA: Diagnosis not present

## 2017-12-18 DIAGNOSIS — L821 Other seborrheic keratosis: Secondary | ICD-10-CM | POA: Diagnosis not present

## 2017-12-18 DIAGNOSIS — L570 Actinic keratosis: Secondary | ICD-10-CM | POA: Diagnosis not present

## 2017-12-18 DIAGNOSIS — D229 Melanocytic nevi, unspecified: Secondary | ICD-10-CM | POA: Diagnosis not present

## 2017-12-18 DIAGNOSIS — L819 Disorder of pigmentation, unspecified: Secondary | ICD-10-CM | POA: Diagnosis not present

## 2017-12-18 DIAGNOSIS — L578 Other skin changes due to chronic exposure to nonionizing radiation: Secondary | ICD-10-CM | POA: Diagnosis not present

## 2017-12-18 DIAGNOSIS — L814 Other melanin hyperpigmentation: Secondary | ICD-10-CM | POA: Diagnosis not present

## 2017-12-18 DIAGNOSIS — D1801 Hemangioma of skin and subcutaneous tissue: Secondary | ICD-10-CM | POA: Diagnosis not present

## 2018-01-12 DIAGNOSIS — L578 Other skin changes due to chronic exposure to nonionizing radiation: Secondary | ICD-10-CM | POA: Diagnosis not present

## 2018-03-07 ENCOUNTER — Ambulatory Visit (INDEPENDENT_AMBULATORY_CARE_PROVIDER_SITE_OTHER): Payer: Medicare HMO

## 2018-03-07 ENCOUNTER — Ambulatory Visit (INDEPENDENT_AMBULATORY_CARE_PROVIDER_SITE_OTHER): Payer: Medicare HMO | Admitting: Family Medicine

## 2018-03-07 ENCOUNTER — Encounter: Payer: Self-pay | Admitting: Family Medicine

## 2018-03-07 VITALS — BP 124/74 | HR 90 | Temp 98.1°F | Resp 12 | Ht 70.0 in | Wt 169.2 lb

## 2018-03-07 DIAGNOSIS — R05 Cough: Secondary | ICD-10-CM

## 2018-03-07 DIAGNOSIS — J181 Lobar pneumonia, unspecified organism: Secondary | ICD-10-CM

## 2018-03-07 DIAGNOSIS — R059 Cough, unspecified: Secondary | ICD-10-CM

## 2018-03-07 DIAGNOSIS — J189 Pneumonia, unspecified organism: Secondary | ICD-10-CM

## 2018-03-07 MED ORDER — HYDROCODONE-HOMATROPINE 5-1.5 MG/5ML PO SYRP: 5.0000 mL | ORAL_SOLUTION | Freq: Two times a day (BID) | ORAL | 0 refills | Status: AC | PRN

## 2018-03-07 MED ORDER — BENZONATATE 100 MG PO CAPS: 200.0000 mg | ORAL_CAPSULE | Freq: Two times a day (BID) | ORAL | 0 refills | Status: DC | PRN

## 2018-03-07 NOTE — Progress Notes (Signed)
ACUTE VISIT  HPI:  Chief Complaint  Patient presents with  . Cough    started more than a week ago    FrancisRolland E Bordas is a 83 y.o.male here today complaining of 14 days of respiratory symptoms.  Productive cough with clear/greenish sputum, denies hemoptysis. Negative for dyspnea, wheezing, or chest pain. He has not noted fever, chills, body aches, or changes in appetite.  Cough is interfering with sleep.  States that he does not feel sick.  Cough  This is a new problem. The current episode started 1 to 4 weeks ago. The problem has been unchanged. The cough is productive of sputum. Associated symptoms include nasal congestion and rhinorrhea. Pertinent negatives include no chest pain, chills, ear congestion, ear pain, eye redness, fever, headaches, heartburn, hemoptysis, myalgias, postnasal drip, rash, sore throat, shortness of breath, sweats or wheezing. The symptoms are aggravated by lying down. He has tried OTC cough suppressant for the symptoms. The treatment provided mild relief. There is no history of asthma, COPD or environmental allergies.   No Hx of recent travel. His wife was recently diagnosed with pneumonia. No known insect bite.  OTC medications for this problem: NyQuil.  Review of Systems  Constitutional: Negative for activity change, appetite change, chills, fatigue and fever.  HENT: Positive for congestion and rhinorrhea. Negative for ear pain, mouth sores, postnasal drip, sore throat and voice change.   Eyes: Negative for discharge and redness.  Respiratory: Positive for cough. Negative for hemoptysis, shortness of breath and wheezing.   Cardiovascular: Negative for chest pain.  Gastrointestinal: Negative for abdominal pain, diarrhea, heartburn, nausea and vomiting.  Musculoskeletal: Negative for back pain and myalgias.  Skin: Negative for rash.  Allergic/Immunologic: Negative for environmental allergies.  Neurological: Negative for weakness and  headaches.  Psychiatric/Behavioral: Positive for sleep disturbance. Negative for confusion.    Current Outpatient Medications on File Prior to Visit  Medication Sig Dispense Refill  . Multiple Vitamin (MULTIVITAMIN) tablet Take 1 tablet by mouth daily.    Marland Kitchen olmesartan (BENICAR) 20 MG tablet Take 1 tablet (20 mg total) by mouth daily. 90 tablet 2   No current facility-administered medications on file prior to visit.      Past Medical History:  Diagnosis Date  . Hypertension   . Skin cancer    Melanoma in situ, BCC,and SCC. Follows with dermatologists every 6 months.   No Known Allergies  Social History   Socioeconomic History  . Marital status: Married    Spouse name: Not on file  . Number of children: Not on file  . Years of education: Not on file  . Highest education level: Not on file  Occupational History  . Not on file  Social Needs  . Financial resource strain: Not on file  . Food insecurity:    Worry: Not on file    Inability: Not on file  . Transportation needs:    Medical: Not on file    Non-medical: Not on file  Tobacco Use  . Smoking status: Never Smoker  . Smokeless tobacco: Never Used  Substance and Sexual Activity  . Alcohol use: No  . Drug use: No  . Sexual activity: Not on file  Lifestyle  . Physical activity:    Days per week: Not on file    Minutes per session: Not on file  . Stress: Not on file  Relationships  . Social connections:    Talks on phone: Not on file  Gets together: Not on file    Attends religious service: Not on file    Active member of club or organization: Not on file    Attends meetings of clubs or organizations: Not on file    Relationship status: Not on file  Other Topics Concern  . Not on file  Social History Narrative  . Not on file    Vitals:   03/07/18 1203  BP: 124/74  Pulse: 90  Resp: 12  Temp: 98.1 F (36.7 C)  SpO2: 97%   Body mass index is 24.28 kg/m.  Physical Exam  Nursing note and vitals  reviewed. Constitutional: He is oriented to person, place, and time. He appears well-developed and well-nourished. He does not appear ill. No distress.  HENT:  Head: Normocephalic and atraumatic.  Nose: Rhinorrhea present.  Mouth/Throat: Oropharynx is clear and moist and mucous membranes are normal.  Eyes: Pupils are equal, round, and reactive to light. Conjunctivae are normal.  Cardiovascular: Normal rate and regular rhythm.  No murmur heard. Respiratory: Effort normal and breath sounds normal. No stridor. No respiratory distress.  Lymphadenopathy:    He has no cervical adenopathy.  Neurological: He is alert and oriented to person, place, and time. He has normal strength. Gait normal.  Skin: Skin is warm. No rash noted. No erythema.  Psychiatric: He has a normal mood and affect.  Well groomed, good eye contact.    ASSESSMENT AND PLAN:  Francis Park was seen today for cough.  Diagnoses and all orders for this visit:  Cough We discussed possible etiologies. Lung auscultation today is negative. Symptomatic treatment with cough suppressant recommended for now, we discussed some side effects. Adequate hydration. Instructed about warning signs. CXR ordered today, further recommendation will be given accordingly.  -     DG Chest 2 View; Future -     benzonatate (TESSALON) 100 MG capsule; Take 2 capsules (200 mg total) by mouth 2 (two) times daily as needed for cough. -     HYDROcodone-homatropine (HYCODAN) 5-1.5 MG/5ML syrup; Take 5 mLs by mouth every 12 (twelve) hours as needed for up to 10 days.  Respiratory tract infection Right lung infiltrates vs atelectasis. Doxycycline 100 mg bid x 7 days recommended. CXR to be related in 4 weeks.   -     doxycycline (VIBRA-TABS) 100 MG tablet; Take 1 tablet (100 mg total) by mouth 2 (two) times daily for 7 days.     Return for Need f/u appt for chronic problems and CPE.    Johnnie Goynes G. Martinique, MD  [facility]. [place]  office.

## 2018-03-07 NOTE — Patient Instructions (Signed)
A few things to remember from today's visit:   Cough - Plan: DG Chest 2 View, benzonatate (TESSALON) 100 MG capsule, HYDROcodone-homatropine (HYCODAN) 5-1.5 MG/5ML syrup  This seems to be a procedure symptoms progressing respiratory infection. Hydrocodone to take mainly at night because it causes drowsiness. Cough and congestion can last a few weeks. Monitor for fever, shortness of breath, wheezing, or worsening cough.  Please be sure medication list is accurate. If a new problem present, please set up appointment sooner than planned today.

## 2018-03-08 MED ORDER — DOXYCYCLINE HYCLATE 100 MG PO TABS: 100.0000 mg | ORAL_TABLET | Freq: Two times a day (BID) | ORAL | 0 refills | Status: AC

## 2018-03-14 ENCOUNTER — Encounter: Payer: Medicare HMO | Admitting: Family Medicine

## 2018-03-28 ENCOUNTER — Encounter: Payer: Self-pay | Admitting: Family Medicine

## 2018-03-28 ENCOUNTER — Other Ambulatory Visit: Payer: Self-pay

## 2018-03-28 ENCOUNTER — Ambulatory Visit (INDEPENDENT_AMBULATORY_CARE_PROVIDER_SITE_OTHER): Payer: Medicare HMO | Admitting: Family Medicine

## 2018-03-28 VITALS — BP 125/78 | HR 79 | Temp 97.7°F | Resp 12 | Ht 70.0 in | Wt 173.4 lb

## 2018-03-28 DIAGNOSIS — Z23 Encounter for immunization: Secondary | ICD-10-CM | POA: Diagnosis not present

## 2018-03-28 DIAGNOSIS — I10 Essential (primary) hypertension: Secondary | ICD-10-CM | POA: Diagnosis not present

## 2018-03-28 DIAGNOSIS — Z0000 Encounter for general adult medical examination without abnormal findings: Secondary | ICD-10-CM

## 2018-03-28 LAB — COMPREHENSIVE METABOLIC PANEL
ALT: 16 U/L (ref 0–53)
AST: 19 U/L (ref 0–37)
Albumin: 4.4 g/dL (ref 3.5–5.2)
Alkaline Phosphatase: 62 U/L (ref 39–117)
BUN: 17 mg/dL (ref 6–23)
CO2: 25 mEq/L (ref 19–32)
Calcium: 9.5 mg/dL (ref 8.4–10.5)
Chloride: 99 mEq/L (ref 96–112)
Creatinine, Ser: 0.89 mg/dL (ref 0.40–1.50)
GFR: 82 mL/min (ref >60.00)
Glucose, Bld: 88 mg/dL (ref 70–99)
Potassium: 4.3 mEq/L (ref 3.5–5.1)
Sodium: 134 mEq/L — ABNORMAL LOW (ref 135–145)
Total Bilirubin: 1 mg/dL (ref 0.2–1.2)
Total Protein: 7.2 g/dL (ref 6.0–8.3)

## 2018-03-28 MED ORDER — OLMESARTAN MEDOXOMIL 20 MG PO TABS: 20.0000 mg | ORAL_TABLET | Freq: Every day | ORAL | 3 refills | Status: DC

## 2018-03-28 NOTE — Assessment & Plan Note (Signed)
BP adequately controlled. No changes in Benicar 20 mg daily. Continue low-salt diet. Recommend arrange an appointment for eye exam. He would like to follow annually.

## 2018-03-28 NOTE — Progress Notes (Signed)
HPI:  [name] is a 83 y.o.male here today with his wife for his routine physical examination and preventive Medicare visit.  Last CPE: 02/06/2017. Last  AWV 02/06/2017. He lives with his wife.  Regular exercise 3 or more times per week: He walks 5 times per week for 30 minutes. Following a healthy diet: Yes  Chronic medical problems: Hypertension and skin cancer.  Immunization History  Administered Date(s) Administered  . Influenza, High Dose Seasonal PF 10/27/2017    -Denies high alcohol intake or Hx of illicit drug use. Former smoker, quitting smoking in 1961.  PSA done in 01/2017 0.93. Nocturia x1-2, this has been stable for years. He denies dysuria, gross hematuria, urgency, or changes in frequency.  Hypertension:  Currently on Benicar 20 mg daily. He does not check BP at home. He has not noted headaches, chest pain, palpitations, or edema. Heart auscultation today mildly irregular, he is reporting prior history.   Lab Results  Component Value Date   CREATININE 0.80 02/06/2017   BUN 13 02/06/2017   NA 137 02/06/2017   K 4.5 02/06/2017   CL 101 02/06/2017   CO2 27 02/06/2017    Independent ADL's and IADL's. No falls in the past year and denies depression symptoms.  Functional Status Survey: Is the patient deaf or have difficulty hearing?: No Does the patient have difficulty seeing, even when wearing glasses/contacts?: No Does the patient have difficulty concentrating, remembering, or making decisions?: No Does the patient have difficulty walking or climbing stairs?: No Does the patient have difficulty dressing or bathing?: No Does the patient have difficulty doing errands alone such as visiting a doctor's office or shopping?: No  Fall Risk  03/28/2018 02/06/2017  Falls in the past year? 0 Yes  Number falls in past yr: 0 1  Injury with Fall? 0 No  Follow up Education provided -    Providers he sees regularly:  Eye care provider: He has  not established with a provider here in [place].  When living in [place] he had annual eye exam, last one in 2018. He also follows with dermatologist, Dr. Pearline Cables, last visit in 11/2017.  Depression screen [facility] 2/9 03/28/2018  Decreased Interest 0  Down, Depressed, Hopeless 0  PHQ - 2 Score 0    Mini-Cog - 03/28/18 0902    Normal clock drawing test?  yes    How many words correct?  3       Visual Acuity Screening   Right eye Left eye Both eyes  Without correction: 20/50 20/50 20/40   With correction:        Review of Systems  Constitutional: Negative for activity change, appetite change, fatigue and fever.  HENT: Negative for dental problem, nosebleeds and sore throat.   Eyes: Negative for redness and visual disturbance.  Respiratory: Negative for cough, shortness of breath and wheezing.   Cardiovascular: Negative for chest pain, palpitations and leg swelling.  Gastrointestinal: Negative for abdominal pain, blood in stool, nausea and vomiting.  Endocrine: Negative for cold intolerance, heat intolerance, polydipsia, polyphagia and polyuria.  Genitourinary: Negative for decreased urine volume, dysuria, genital sores, hematuria and testicular pain.  Musculoskeletal: Negative for gait problem and myalgias.  Skin: Negative for rash.  Allergic/Immunologic: Positive for environmental allergies.  Neurological: Negative for syncope, weakness and headaches.  Psychiatric/Behavioral: Negative for confusion and sleep disturbance. The patient is not nervous/anxious.   All other systems reviewed and are negative.    Current Outpatient Medications on File  Prior to Visit  Medication Sig Dispense Refill  . Multiple Vitamin (MULTIVITAMIN) tablet Take 1 tablet by mouth daily.     No current facility-administered medications on file prior to visit.      Past Medical History:  Diagnosis Date  . Hypertension   . Skin cancer    Melanoma in situ, BCC,and SCC. Follows with dermatologists every  6 months.    History reviewed. No pertinent surgical history.  No Known Allergies  Family History  Problem Relation Age of Onset  . Heart disease Father        CAD  . Hypertension Father   . Cancer Neg Hx     Social History   Socioeconomic History  . Marital status: Married    Spouse name: Not on file  . Number of children: Not on file  . Years of education: Not on file  . Highest education level: Not on file  Occupational History  . Not on file  Social Needs  . Financial resource strain: Not on file  . Food insecurity:    Worry: Not on file    Inability: Not on file  . Transportation needs:    Medical: Not on file    Non-medical: Not on file  Tobacco Use  . Smoking status: Never Smoker  . Smokeless tobacco: Never Used  Substance and Sexual Activity  . Alcohol use: No  . Drug use: No  . Sexual activity: Not on file  Lifestyle  . Physical activity:    Days per week: Not on file    Minutes per session: Not on file  . Stress: Not on file  Relationships  . Social connections:    Talks on phone: Not on file    Gets together: Not on file    Attends religious service: Not on file    Active member of club or organization: Not on file    Attends meetings of clubs or organizations: Not on file    Relationship status: Not on file  Other Topics Concern  . Not on file  Social History Narrative  . Not on file     Vitals:   03/28/18 0831  BP: 125/78  Pulse: 79  Resp: 12  Temp: 97.7 F (36.5 C)  SpO2: 97%   Body mass index is 24.88 kg/m.   Wt Readings from Last 3 Encounters:  03/28/18 173 lb 6 oz (78.6 kg)  03/07/18 169 lb 4 oz (76.8 kg)  10/04/17 172 lb 8 oz (78.2 kg)    Physical Exam  Nursing note and vitals reviewed. Constitutional: He is oriented to person, place, and time. He appears well-developed and well-nourished. No distress.  HENT:  Head: Normocephalic and atraumatic.  Right Ear: Tympanic membrane, external ear and ear canal normal.    Left Ear: Tympanic membrane, external ear and ear canal normal.  Mouth/Throat: Oropharynx is clear and moist and mucous membranes are normal.  Eyes: Pupils are equal, round, and reactive to light. Conjunctivae and EOM are normal.  Neck: Normal range of motion. No tracheal deviation present. No thyromegaly present.  Cardiovascular: Normal rate and regular rhythm.  Occasional extrasystoles are present.  No murmur heard. DP and PT pulses present, bilateral.  Respiratory: Effort normal and breath sounds normal. No respiratory distress.  GI: Soft. He exhibits no mass. There is no hepatomegaly. There is no abdominal tenderness.  Musculoskeletal:        General: No tenderness or edema.  Lymphadenopathy:    He has no cervical  adenopathy.  Neurological: He is alert and oriented to person, place, and time. He has normal strength. No cranial nerve deficit or sensory deficit. Coordination and gait normal.  Reflex Scores:      Bicep reflexes are 2+ on the right side and 2+ on the left side.      Patellar reflexes are 2+ on the right side and 2+ on the left side. Skin: Skin is warm. No erythema.  Psychiatric: He has a normal mood and affect.  Well-groomed, good eye contact.   ASSESSMENT AND PLAN:  Mr. [name] was seen today for annual exam and AWV.  Orders Placed This Encounter  Procedures  . Pneumococcal conjugate vaccine 13-valent IM  . Comprehensive metabolic panel   Lab Results  Component Value Date   CREATININE 0.89 03/28/2018   BUN 17 03/28/2018   NA 134 (L) 03/28/2018   K 4.3 03/28/2018   CL 99 03/28/2018   CO2 25 03/28/2018   Lab Results  Component Value Date   ALT 16 03/28/2018   AST 19 03/28/2018   ALKPHOS 62 03/28/2018   BILITOT 1.0 03/28/2018     Routine general medical examination at a health care facility We discussed the importance of regular physical activity and healthy diet for prevention of chronic illness and/or complications. Preventive guidelines  reviewed. Prostate cancer screening recommendations were discussed, he agrees on holding on PSA test until next year given the fact he is not having urinary symptoms or changes in frequency.  Next CPE in a year.  Medicare annual wellness visit, subsequent We discussed the importance of staying active, physically and mentally, as well as the benefits of a healthy/balance diet. Low impact exercise that involve stretching and strengthing are ideal. Vaccines updated. We discussed preventive screening for the next 5-10 years, summery of recommendations given in AVS: Fall prevention. Eye exam every 1 to 2 years for glaucoma screening.  Advance directives and end of life discussed, he has power of attorney and living will, I asked him to bring a copy if possible.   Need for vaccination against Streptococcus pneumoniae using pneumococcal conjugate vaccine 13 -     Pneumococcal conjugate vaccine 13-valent IM  Hypertension, essential, benign BP adequately controlled. No changes in Benicar 20 mg daily. Continue low-salt diet. Recommend arrange an appointment for eye exam. He would like to follow annually.   Return in 1 year (on 03/28/2019) for CPE and f/u.    Katelinn Justice G. Martinique, MD  [facility]. [place] office.

## 2018-03-28 NOTE — Patient Instructions (Addendum)
A few things to remember from today's visit:   Routine general medical examination at a health care facility  Hypertension, essential, benign - Plan: Comprehensive metabolic panel   A few tips:  -As we age balance is not as good as it was, so there is a higher risks for falls. Please remove small rugs and furniture that is "in your way" and could increase the risk of falls. Stretching exercises may help with fall prevention: Yoga and Tai Chi are some examples. Low impact exercise is better, so you are not very achy the next day.  -Sun screen and avoidance of direct sun light recommended. Caution with dehydration, if working outdoors be sure to drink enough fluids.  - Some medications are not safe as we age, increases the risk of side effects and can potentially interact with other medication you are also taken;  including some of over the counter medications. Be sure to let me know when you start a new medication even if it is a dietary/vitamin supplement.   -Healthy diet low in red meet/animal fat and sugar + regular physical activity is recommended.    Screening schedule for the next 5-10 years:  Glaucoma screening/eye exam every 1-2 years.  Flu vaccine annually.  Fall prevention

## 2018-03-29 DIAGNOSIS — Z0000 Encounter for general adult medical examination without abnormal findings: Secondary | ICD-10-CM | POA: Diagnosis not present

## 2018-03-29 DIAGNOSIS — I10 Essential (primary) hypertension: Secondary | ICD-10-CM | POA: Diagnosis not present

## 2018-03-29 DIAGNOSIS — Z23 Encounter for immunization: Secondary | ICD-10-CM | POA: Diagnosis not present

## 2018-07-09 DIAGNOSIS — L578 Other skin changes due to chronic exposure to nonionizing radiation: Secondary | ICD-10-CM | POA: Diagnosis not present

## 2019-01-21 DIAGNOSIS — C44629 Squamous cell carcinoma of skin of left upper limb, including shoulder: Secondary | ICD-10-CM | POA: Diagnosis not present

## 2019-01-21 DIAGNOSIS — L814 Other melanin hyperpigmentation: Secondary | ICD-10-CM | POA: Diagnosis not present

## 2019-01-21 DIAGNOSIS — L570 Actinic keratosis: Secondary | ICD-10-CM | POA: Diagnosis not present

## 2019-01-21 DIAGNOSIS — L821 Other seborrheic keratosis: Secondary | ICD-10-CM | POA: Diagnosis not present

## 2019-01-21 DIAGNOSIS — D229 Melanocytic nevi, unspecified: Secondary | ICD-10-CM | POA: Diagnosis not present

## 2019-01-21 DIAGNOSIS — D485 Neoplasm of uncertain behavior of skin: Secondary | ICD-10-CM | POA: Diagnosis not present

## 2019-01-27 ENCOUNTER — Ambulatory Visit: Payer: Medicare Other | Attending: Family Medicine

## 2019-01-27 DIAGNOSIS — Z23 Encounter for immunization: Secondary | ICD-10-CM

## 2019-01-27 NOTE — Progress Notes (Unsigned)
   Covid-19 Vaccination Clinic  Name:  Francis Park    MRN: [id number] DOB: Jul 04, 1935  01/27/2019  [name] was observed post Covid-19 immunization for 15 minutes without incidence. He was provided with Vaccine Information Sheet and instruction to access the V-Safe system.   [name] was instructed to call 911 with any severe reactions post vaccine: Marland Kitchen Difficulty breathing  . Swelling of your face and throat  . A fast heartbeat  . A bad rash all over your body  . Dizziness and weakness    Immunizations Administered    Name Date Dose VIS Date Route   Pfizer COVID-19 Vaccine 01/27/2019 12:07 PM 0.3 mL 12/28/2018 Intramuscular   Manufacturer: [place]   Lot: [id number]   [place]: [id number]

## 2019-02-16 ENCOUNTER — Ambulatory Visit: Payer: Medicare HMO | Attending: Internal Medicine

## 2019-02-16 DIAGNOSIS — Z23 Encounter for immunization: Secondary | ICD-10-CM

## 2019-02-16 NOTE — Progress Notes (Signed)
   Covid-19 Vaccination Clinic  Name:  Francis Park    MRN: [id number] DOB: May 06, 1935  02/16/2019  Francis Park was observed post Covid-19 immunization for 15 minutes without incidence. He was provided with Vaccine Information Sheet and instruction to access the V-Safe system.   Francis Park was instructed to call 911 with any severe reactions post vaccine: Marland Kitchen Difficulty breathing  . Swelling of your face and throat  . A fast heartbeat  . A bad rash all over your body  . Dizziness and weakness    Immunizations Administered    Name Date Dose VIS Date Route   Pfizer COVID-19 Vaccine 02/16/2019  2:56 PM 0.3 mL 12/28/2018 Intramuscular   Manufacturer: [place]   Lot: [id number]   [place]: [id number]

## 2019-02-17 ENCOUNTER — Ambulatory Visit: Payer: Medicare HMO

## 2019-02-20 DIAGNOSIS — D0462 Carcinoma in situ of skin of left upper limb, including shoulder: Secondary | ICD-10-CM | POA: Diagnosis not present

## 2019-03-13 DIAGNOSIS — L570 Actinic keratosis: Secondary | ICD-10-CM | POA: Diagnosis not present

## 2019-03-13 DIAGNOSIS — L819 Disorder of pigmentation, unspecified: Secondary | ICD-10-CM | POA: Diagnosis not present

## 2019-04-05 ENCOUNTER — Other Ambulatory Visit: Payer: Self-pay

## 2019-04-08 ENCOUNTER — Other Ambulatory Visit: Payer: Self-pay

## 2019-04-08 ENCOUNTER — Ambulatory Visit (INDEPENDENT_AMBULATORY_CARE_PROVIDER_SITE_OTHER): Payer: Medicare HMO | Admitting: Family Medicine

## 2019-04-08 ENCOUNTER — Encounter: Payer: Self-pay | Admitting: Family Medicine

## 2019-04-08 VITALS — BP 138/70 | HR 81 | Resp 12 | Ht 70.0 in | Wt 174.0 lb

## 2019-04-08 DIAGNOSIS — R319 Hematuria, unspecified: Secondary | ICD-10-CM

## 2019-04-08 DIAGNOSIS — Z0000 Encounter for general adult medical examination without abnormal findings: Secondary | ICD-10-CM | POA: Diagnosis not present

## 2019-04-08 DIAGNOSIS — I10 Essential (primary) hypertension: Secondary | ICD-10-CM | POA: Diagnosis not present

## 2019-04-08 LAB — COMPREHENSIVE METABOLIC PANEL
ALT: 16 U/L (ref 0–53)
AST: 20 U/L (ref 0–37)
Albumin: 4.2 g/dL (ref 3.5–5.2)
Alkaline Phosphatase: 56 U/L (ref 39–117)
BUN: 17 mg/dL (ref 6–23)
CO2: 26 mEq/L (ref 19–32)
Calcium: 9.7 mg/dL (ref 8.4–10.5)
Chloride: 104 mEq/L (ref 96–112)
Creatinine, Ser: 0.95 mg/dL (ref 0.40–1.50)
GFR: 75.86 mL/min (ref >60.00)
Glucose, Bld: 89 mg/dL (ref 70–99)
Potassium: 4.5 mEq/L (ref 3.5–5.1)
Sodium: 136 mEq/L (ref 135–145)
Total Bilirubin: 1 mg/dL (ref 0.2–1.2)
Total Protein: 7 g/dL (ref 6.0–8.3)

## 2019-04-08 LAB — URINALYSIS, ROUTINE W REFLEX MICROSCOPIC
Bilirubin Urine: NEGATIVE
Ketones, ur: NEGATIVE
Leukocytes,Ua: NEGATIVE
Nitrite: NEGATIVE
Specific Gravity, Urine: 1.025 (ref 1.000–1.030)
Total Protein, Urine: NEGATIVE
Urine Glucose: NEGATIVE
Urobilinogen, UA: 0.2 (ref 0.0–1.0)
WBC, UA: NONE SEEN (ref 0–?)
pH: 5.5 (ref 5.0–8.0)

## 2019-04-08 MED ORDER — OLMESARTAN MEDOXOMIL 20 MG PO TABS: 20.0000 mg | ORAL_TABLET | Freq: Every day | ORAL | 3 refills | Status: DC

## 2019-04-08 NOTE — Patient Instructions (Addendum)
A few things to remember from today's visit:  Francis Park , Thank you for taking time to come for your Medicare Wellness Visit. I appreciate your ongoing commitment to your health goals. Please review the following plan we discussed and let me know if I can assist you in the future.   These are the goals we discussed: Goals    . Exercise 3x per week (30 min per time)     Continue regular physical activity. Fall precautions.        This is a list of the screening recommended for you and due dates:   Health Maintenance  Topic Date Due  . Flu Shot  04/17/2019*  . Pneumonia vaccines  Completed  . Tetanus Vaccine  Discontinued  *Topic was postponed. The date shown is not the original due date.    A few tips:  -As we age balance is not as good as it was, so there is a higher risks for falls. Please remove small rugs and furniture that is "in your way" and could increase the risk of falls. Stretching exercises may help with fall prevention: Yoga and Tai Chi are some examples. Low impact exercise is better, so you are not very achy the next day.  -Sun screen and avoidance of direct sun light recommended. Caution with dehydration, if working outdoors be sure to drink enough fluids.  - Some medications are not safe as we age, increases the risk of side effects and can potentially interact with other medication you are also taken;  including some of over the counter medications. Be sure to let me know when you start a new medication even if it is a dietary/vitamin supplement.   -Healthy diet low in red meet/animal fat and sugar + regular physical activity is recommended.      Please be sure medication list is accurate. If a new problem present, please set up appointment sooner than planned today.

## 2019-04-08 NOTE — Assessment & Plan Note (Signed)
BP adequately controlled. Continue Benicar 20 mg daily and low salt diet. Monitor BP regularly. Possible complications of elevated BP discussed. He would like to continue following annually.

## 2019-04-08 NOTE — Progress Notes (Signed)
HPI:  [name] is a 84 y.o.male here today for his routine physical examination and AWV.  Last AWV on 03/28/18. He lives with his wife. Independent ADL's and IADL's. No falls in the past year and denies depression symptoms.  Functional Status Survey: Is the patient deaf or have difficulty hearing?: No Does the patient have difficulty seeing, even when wearing glasses/contacts?: No Does the patient have difficulty concentrating, remembering, or making decisions?: No Does the patient have difficulty walking or climbing stairs?: No Does the patient have difficulty dressing or bathing?: No Does the patient have difficulty doing errands alone such as visiting a doctor's office or shopping?: No  Fall Risk  04/08/2019 03/28/2018 02/06/2017  Falls in the past year? 0 0 Yes  Number falls in past yr: - 0 1  Injury with Fall? - 0 No  Follow up - Education provided -   Providers he sees regularly: Dermatologist: Dr Pearline Cables, he follows annually.  Depression screen [facility] 2/9 04/08/2019  Decreased Interest 0  Down, Depressed, Hopeless 0  PHQ - 2 Score 0    Mini-Cog - 04/08/19 0944    Normal clock drawing test?  yes    How many words correct?  3        Hearing Screening   '125Hz'$  '250Hz'$  '500Hz'$  '1000Hz'$  '2000Hz'$  '3000Hz'$  '4000Hz'$  '6000Hz'$  '8000Hz'$   Right ear:           Left ear:             Visual Acuity Screening   Right eye Left eye Both eyes  Without correction: '20/25 20/25 20/25 '$  With correction:      Last CPE: 03/28/18  Regular exercise 3 or more times per week: He walks almost daily  Following a healthy diet: In general he follows a healthful diet.  Chronic medical problems: HTN, melanoma in situ, BCC.  HTN: He is on Benicar 20 mg daily. Home BP readings: 130's/70's.  Immunization History  Administered Date(s) Administered  . Influenza, High Dose Seasonal PF 10/27/2017  . PFIZER SARS-COV-2 Vaccination 01/27/2019, 02/16/2019  . Pneumococcal Conjugate-13 03/29/2018    Last prostate ca screening: Last PSA 01/2017 normal at 0.93. Nocturia x 1. He has not noted gross hematuria,dysuria,or decreased urine output.  -Negative for high alcohol intake or Hx of illicit drug use. Former smoker, quit in 1961.  He has no concerns today.  Review of Systems  Constitutional: Negative for activity change, appetite change, fatigue and fever.  HENT: Negative for nosebleeds, sore throat and trouble swallowing.   Eyes: Negative for redness and visual disturbance.  Respiratory: Negative for cough, shortness of breath and wheezing.   Cardiovascular: Negative for chest pain, palpitations and leg swelling.  Gastrointestinal: Negative for abdominal pain, nausea and vomiting.  Endocrine: Negative for cold intolerance, heat intolerance, polydipsia, polyphagia and polyuria.  Genitourinary: Negative for difficulty urinating and testicular pain.  Musculoskeletal: Negative for gait problem and myalgias.  Skin: Negative for rash and wound.  Neurological: Negative for dizziness, weakness and headaches.  Psychiatric/Behavioral: Negative for confusion. The patient is not nervous/anxious.   All other systems reviewed and are negative.  Current Outpatient Medications on File Prior to Visit  Medication Sig Dispense Refill  . Multiple Vitamin (MULTIVITAMIN) tablet Take 1 tablet by mouth daily.     No current facility-administered medications on file prior to visit.   Past Medical History:  Diagnosis Date  . Hypertension   . Skin cancer    Melanoma in situ, BCC,and  SCC. Follows with dermatologists every 6 months.   History reviewed. No pertinent surgical history.  No Known Allergies  Family History  Problem Relation Age of Onset  . Heart disease Father        CAD  . Hypertension Father   . Cancer Neg Hx     Social History   Socioeconomic History  . Marital status: Married    Spouse name: Not on file  . Number of children: Not on file  . Years of education: Not on  file  . Highest education level: Not on file  Occupational History  . Not on file  Tobacco Use  . Smoking status: Never Smoker  . Smokeless tobacco: Never Used  Substance and Sexual Activity  . Alcohol use: No  . Drug use: No  . Sexual activity: Not on file  Other Topics Concern  . Not on file  Social History Narrative  . Not on file   Social Determinants of Health   Financial Resource Strain:   . Difficulty of Paying Living Expenses:   Food Insecurity:   . Worried About Charity fundraiser in the Last Year:   . Arboriculturist in the Last Year:   Transportation Needs:   . Film/video editor (Medical):   Marland Kitchen Lack of Transportation (Non-Medical):   Physical Activity:   . Days of Exercise per Week:   . Minutes of Exercise per Session:   Stress:   . Feeling of Stress :   Social Connections:   . Frequency of Communication with Friends and Family:   . Frequency of Social Gatherings with Friends and Family:   . Attends Religious Services:   . Active Member of Clubs or Organizations:   . Attends Archivist Meetings:   Marland Kitchen Marital Status:    Vitals:   04/08/19 0919  BP: 138/70  Pulse: 81  Resp: 12  SpO2: 97%   Body mass index is 24.97 kg/m.  Wt Readings from Last 3 Encounters:  04/08/19 174 lb (78.9 kg)  03/28/18 173 lb 6 oz (78.6 kg)  03/07/18 169 lb 4 oz (76.8 kg)   Physical Exam  Nursing note and vitals reviewed. Constitutional: He is oriented to person, place, and time. He appears well-developed and well-nourished. No distress.  HENT:  Head: Atraumatic.  Right Ear: Tympanic membrane, external ear and ear canal normal.  Left Ear: Tympanic membrane, external ear and ear canal normal.  Mouth/Throat: Oropharynx is clear and moist and mucous membranes are normal.  Eyes: Pupils are equal, round, and reactive to light. Conjunctivae and EOM are normal.  Neck: No tracheal deviation present. No thyromegaly present.  Cardiovascular: Normal rate and regular  rhythm.  No murmur heard. Pulses:      Dorsalis pedis pulses are 2+ on the right side and 2+ on the left side.  Respiratory: Effort normal and breath sounds normal. No respiratory distress.  GI: Soft. He exhibits no mass. There is no hepatomegaly. There is no abdominal tenderness.  Genitourinary:    Genitourinary Comments: Refused,no concerns.   Musculoskeletal:        General: No tenderness or edema.     Cervical back: Normal range of motion.     Comments: No major deformities appreciated and no signs of synovitis.  Lymphadenopathy:    He has no cervical adenopathy.       Right: No supraclavicular adenopathy present.       Left: No supraclavicular adenopathy present.  Neurological: He is  alert and oriented to person, place, and time. He has normal strength. No cranial nerve deficit or sensory deficit. Coordination and gait normal.  Reflex Scores:      Bicep reflexes are 2+ on the right side and 2+ on the left side.      Patellar reflexes are 2+ on the right side and 2+ on the left side. Skin: Skin is warm. No erythema.  Psychiatric: He has a normal mood and affect. Cognition and memory are normal.  Well groomed, good eye contact.   ASSESSMENT AND PLAN:  Mr.Rolland was seen today for annual exam.  Diagnoses and all orders for this visit:  Orders Placed This Encounter  Procedures  . Comprehensive metabolic panel  . Urinalysis, Routine w reflex microscopic   Lab Results  Component Value Date   CREATININE 0.95 04/08/2019   BUN 17 04/08/2019   NA 136 04/08/2019   K 4.5 04/08/2019   CL 104 04/08/2019   CO2 26 04/08/2019   Lab Results  Component Value Date   ALT 16 04/08/2019   AST 20 04/08/2019   ALKPHOS 56 04/08/2019   BILITOT 1.0 04/08/2019    Routine general medical examination at a health care facility We discussed the importance of regular physical activity and healthy diet for prevention of chronic illness and/or complications. Preventive guidelines reviewed.  Vaccination up to date.  Next CPE in a year.  Hematuria, unspecified type In 02/2017 he reported gross hematuria x 1 episode. Urine dipstick negative at that time. Further recommendations will be given according to UA results.  -     Urinalysis, Routine w reflex microscopic  Medicare annual wellness visit, subsequent We discussed the importance of staying active, physically and mentally, as well as the benefits of a healthy/balance diet. Low impact exercise that involve stretching and strengthing are ideal.  We discussed preventive screening for the next 5-10 years, summery of recommendations discussed and given on AVS.  Fall prevention.  Advance directives and end of life discussed, he has POA and living will.   Hypertension, essential, benign BP adequately controlled. Continue Benicar 20 mg daily and low salt diet. Monitor BP regularly. Possible complications of elevated BP discussed. He would like to continue following annually.   Return in 1 year (on 04/07/2020) for CPE and f/u.  Betty G. Martinique, MD  [facility]. [place] office.  A few things to remember from today's visit:  Mr. Pomplun , Thank you for taking time to come for your Medicare Wellness Visit. I appreciate your ongoing commitment to your health goals. Please review the following plan we discussed and let me know if I can assist you in the future.   These are the goals we discussed: Goals    . Exercise 3x per week (30 min per time)     Continue regular physical activity. Fall precautions.        This is a list of the screening recommended for you and due dates:   Health Maintenance  Topic Date Due  . Flu Shot  04/17/2019*  . Pneumonia vaccines  Completed  . Tetanus Vaccine  Discontinued  *Topic was postponed. The date shown is not the original due date.    A few tips:  -As we age balance is not as good as it was, so there is a higher risks for falls. Please remove small rugs and  furniture that is "in your way" and could increase the risk of falls. Stretching exercises may help with fall prevention:  Yoga and Tai Chi are some examples. Low impact exercise is better, so you are not very achy the next day.  -Sun screen and avoidance of direct sun light recommended. Caution with dehydration, if working outdoors be sure to drink enough fluids.  - Some medications are not safe as we age, increases the risk of side effects and can potentially interact with other medication you are also taken;  including some of over the counter medications. Be sure to let me know when you start a new medication even if it is a dietary/vitamin supplement.   -Healthy diet low in red meet/animal fat and sugar + regular physical activity is recommended.      Please be sure medication list is accurate. If a new problem present, please set up appointment sooner than planned today.

## 2019-04-12 ENCOUNTER — Other Ambulatory Visit: Payer: Self-pay

## 2019-04-12 DIAGNOSIS — R319 Hematuria, unspecified: Secondary | ICD-10-CM

## 2019-05-27 ENCOUNTER — Telehealth: Payer: Self-pay | Admitting: Emergency Medicine

## 2019-05-27 DIAGNOSIS — L820 Inflamed seborrheic keratosis: Secondary | ICD-10-CM | POA: Diagnosis not present

## 2019-05-27 DIAGNOSIS — L570 Actinic keratosis: Secondary | ICD-10-CM | POA: Diagnosis not present

## 2019-05-27 NOTE — Telephone Encounter (Signed)
I am sorry- not at this time; maybe once we get a new provider, he can check back.

## 2019-05-27 NOTE — Telephone Encounter (Signed)
Pt is requesting TOC from Dr Martinique to Francis Park. Is this ok with the both of you?

## 2019-05-28 NOTE — Telephone Encounter (Signed)
It is ok for pt to transfer care. Thanks

## 2019-05-28 NOTE — Telephone Encounter (Signed)
Patient's are deciding to stay with Dr. Martinique until they can find another doctor closer to them.

## 2019-07-08 DIAGNOSIS — L570 Actinic keratosis: Secondary | ICD-10-CM | POA: Diagnosis not present

## 2019-11-16 ENCOUNTER — Ambulatory Visit: Payer: Medicare HMO

## 2019-11-30 ENCOUNTER — Ambulatory Visit: Payer: Medicare HMO | Attending: Internal Medicine

## 2019-11-30 DIAGNOSIS — Z23 Encounter for immunization: Secondary | ICD-10-CM

## 2019-11-30 NOTE — Progress Notes (Signed)
   Covid-19 Vaccination Clinic  Name:  NORBERT MALKIN    MRN: [id number] DOB: 05-06-1935  11/30/2019  Mr. Cuffe was observed post Covid-19 immunization for 15 minutes without incident. He was provided with Vaccine Information Sheet and instruction to access the V-Safe system.   Mr. Accomando was instructed to call 911 with any severe reactions post vaccine: Marland Kitchen Difficulty breathing  . Swelling of face and throat  . A fast heartbeat  . A bad rash all over body  . Dizziness and weakness

## 2020-01-27 DIAGNOSIS — L578 Other skin changes due to chronic exposure to nonionizing radiation: Secondary | ICD-10-CM | POA: Diagnosis not present

## 2020-01-27 DIAGNOSIS — L570 Actinic keratosis: Secondary | ICD-10-CM | POA: Diagnosis not present

## 2020-01-27 DIAGNOSIS — D0359 Melanoma in situ of other part of trunk: Secondary | ICD-10-CM | POA: Diagnosis not present

## 2020-01-27 DIAGNOSIS — Z85820 Personal history of malignant melanoma of skin: Secondary | ICD-10-CM | POA: Diagnosis not present

## 2020-01-27 DIAGNOSIS — C4362 Malignant melanoma of left upper limb, including shoulder: Secondary | ICD-10-CM | POA: Diagnosis not present

## 2020-02-04 DIAGNOSIS — C4362 Malignant melanoma of left upper limb, including shoulder: Secondary | ICD-10-CM | POA: Diagnosis not present

## 2020-02-04 DIAGNOSIS — L988 Other specified disorders of the skin and subcutaneous tissue: Secondary | ICD-10-CM | POA: Diagnosis not present

## 2020-02-04 DIAGNOSIS — Z85820 Personal history of malignant melanoma of skin: Secondary | ICD-10-CM | POA: Diagnosis not present

## 2020-02-04 DIAGNOSIS — D0359 Melanoma in situ of other part of trunk: Secondary | ICD-10-CM | POA: Diagnosis not present

## 2020-02-06 ENCOUNTER — Emergency Department (HOSPITAL_COMMUNITY): Payer: Medicare HMO

## 2020-02-06 ENCOUNTER — Encounter (HOSPITAL_COMMUNITY): Payer: Self-pay | Admitting: Emergency Medicine

## 2020-02-06 ENCOUNTER — Emergency Department (HOSPITAL_COMMUNITY)
Admission: EM | Admit: 2020-02-06 | Discharge: 2020-02-06 | Disposition: A | Discharge status: Home / Self Care | Payer: Medicare HMO | Attending: Emergency Medicine | Admitting: Emergency Medicine

## 2020-02-06 DIAGNOSIS — S01112A Laceration without foreign body of left eyelid and periocular area, initial encounter: Secondary | ICD-10-CM | POA: Diagnosis not present

## 2020-02-06 DIAGNOSIS — W1811XA Fall from or off toilet without subsequent striking against object, initial encounter: Secondary | ICD-10-CM | POA: Insufficient documentation

## 2020-02-06 DIAGNOSIS — S0101XA Laceration without foreign body of scalp, initial encounter: Secondary | ICD-10-CM | POA: Diagnosis not present

## 2020-02-06 DIAGNOSIS — R404 Transient alteration of awareness: Secondary | ICD-10-CM | POA: Diagnosis not present

## 2020-02-06 DIAGNOSIS — S0181XA Laceration without foreign body of other part of head, initial encounter: Secondary | ICD-10-CM | POA: Insufficient documentation

## 2020-02-06 DIAGNOSIS — S0990XA Unspecified injury of head, initial encounter: Secondary | ICD-10-CM | POA: Diagnosis not present

## 2020-02-06 DIAGNOSIS — I10 Essential (primary) hypertension: Secondary | ICD-10-CM | POA: Diagnosis not present

## 2020-02-06 DIAGNOSIS — J449 Chronic obstructive pulmonary disease, unspecified: Secondary | ICD-10-CM | POA: Diagnosis not present

## 2020-02-06 DIAGNOSIS — Z85828 Personal history of other malignant neoplasm of skin: Secondary | ICD-10-CM | POA: Diagnosis not present

## 2020-02-06 DIAGNOSIS — S022XXA Fracture of nasal bones, initial encounter for closed fracture: Secondary | ICD-10-CM | POA: Diagnosis not present

## 2020-02-06 DIAGNOSIS — R55 Syncope and collapse: Secondary | ICD-10-CM | POA: Diagnosis not present

## 2020-02-06 DIAGNOSIS — S0992XA Unspecified injury of nose, initial encounter: Secondary | ICD-10-CM | POA: Diagnosis present

## 2020-02-06 LAB — CBC WITH DIFFERENTIAL/PLATELET
Abs Immature Granulocytes: 0.05 10*3/uL (ref 0.00–0.07)
Basophils Absolute: 0 10*3/uL (ref 0.0–0.1)
Basophils Relative: 0 %
Eosinophils Absolute: 0.1 10*3/uL (ref 0.0–0.5)
Eosinophils Relative: 0 %
HCT: 45.1 % (ref 39.0–52.0)
Hemoglobin: 15.3 g/dL (ref 13.0–17.0)
Immature Granulocytes: 0 %
Lymphocytes Relative: 8 %
Lymphs Abs: 0.9 10*3/uL (ref 0.7–4.0)
MCH: 32.6 pg (ref 26.0–34.0)
MCHC: 33.9 g/dL (ref 30.0–36.0)
MCV: 96 fL (ref 80.0–100.0)
Monocytes Absolute: 0.7 10*3/uL (ref 0.1–1.0)
Monocytes Relative: 6 %
Neutro Abs: 9.9 10*3/uL — ABNORMAL HIGH (ref 1.7–7.7)
Neutrophils Relative %: 86 %
Platelets: 370 10*3/uL (ref 150–400)
RBC: 4.7 MIL/uL (ref 4.22–5.81)
RDW: 12.6 % (ref 11.5–15.5)
WBC: 11.7 10*3/uL — ABNORMAL HIGH (ref 4.0–10.5)
nRBC: 0 % (ref 0.0–0.2)

## 2020-02-06 LAB — BASIC METABOLIC PANEL
Anion gap: 11 (ref 5–15)
BUN: 15 mg/dL (ref 8–23)
CO2: 25 mmol/L (ref 22–32)
Calcium: 9.3 mg/dL (ref 8.9–10.3)
Chloride: 101 mmol/L (ref 98–111)
Creatinine, Ser: 0.88 mg/dL (ref 0.61–1.24)
GFR, Estimated: >60 mL/min (ref >60)
Glucose, Bld: 120 mg/dL — ABNORMAL HIGH (ref 70–99)
Potassium: 3.9 mmol/L (ref 3.5–5.1)
Sodium: 137 mmol/L (ref 135–145)

## 2020-02-06 MED ORDER — LIDOCAINE-EPINEPHRINE 2 %-1:100000 IJ SOLN
20.0000 mL | Freq: Once | INTRAMUSCULAR | Status: AC
  Administered 2020-02-06: 20 mL
  Filled 2020-02-06: qty 1

## 2020-02-06 NOTE — ED Notes (Signed)
Francis Park, daughter, would like a call with update on care of dad, [phone].

## 2020-02-06 NOTE — ED Notes (Signed)
Call granddaughter first, Paul Half, [phone].

## 2020-02-06 NOTE — Discharge Instructions (Signed)
For your facial lacerations, you can follow-up with your primary doctor or ask your dermatologist to evaluate your wounds and remove the sutures next week.  Your sutures should be in place for around 7 days.  For your nose fracture, recommend follow-up with ENT.  Due to the episode of passing out, I would recommend following up with your primary care doctor regardless at some point.  If you have any additional episodes of passing out, chest pain, difficulty breathing or other new concerning symptom, return to ER for reassessment.

## 2020-02-06 NOTE — ED Triage Notes (Signed)
Pt BIB EMS from home c/o fall. Pt had syncopal episode getting out of shower and hit head on bathroom counter. Lac on left eyebrow and multiple abrasions to left side of head, nose and left shoulder. Not on blood thinners. + for LOC - pt doesn't remember falling. Pt endorses HA.

## 2020-02-06 NOTE — ED Provider Notes (Signed)
[facility] DEPT Provider Note   CSN: [id number] Arrival date & time: 02/06/20  1613     History Chief Complaint  Patient presents with  . Fall  . Head Laceration    Francis Park is a 85 y.o. male.  Presents to ER with concern for fall, head laceration, possible syncopal episode.  Patient states that he was in his shower when he fell and hit his head.  Unsure if he passed out.  States he does not have any.  Currently, did mention headache to triage nurse.  Not on blood thinners.  No prior episodes of syncope.  Was not having chest pain or difficulty breathing today.  History of hypertension, skin cancer.  HPI     Past Medical History:  Diagnosis Date  . Hypertension   . Skin cancer    Melanoma in situ, BCC,and SCC. Follows with dermatologists every 6 months.    Patient Active Problem List   Diagnosis Date Noted  . Hypertension, essential, benign 08/02/2016    History reviewed. No pertinent surgical history.     Family History  Problem Relation Age of Onset  . Heart disease Father        CAD  . Hypertension Father   . Cancer Neg Hx     Social History   Tobacco Use  . Smoking status: Never Smoker  . Smokeless tobacco: Never Used  Substance Use Topics  . Alcohol use: No  . Drug use: No    Home Medications Prior to Admission medications   Medication Sig Start Date End Date Taking? Authorizing Provider  acetaminophen (TYLENOL) 325 MG tablet Take 650 mg by mouth every 6 (six) hours as needed for moderate pain.   Yes [provider]  Ascorbic Acid (VITAMIN C) 1000 MG tablet Take 1,000 mg by mouth daily.   Yes [provider]  cholecalciferol (VITAMIN D3) 25 MCG (1000 UNIT) tablet Take 1,000 Units by mouth daily.   Yes [provider]  fluoruracil (CARAC) 0.5 % cream Apply 1 application topically 2 (two) times daily.   Yes [provider]  Multiple Vitamin (MULTIVITAMIN) tablet Take 1 tablet  by mouth daily.   Yes [provider]  olmesartan (BENICAR) 20 MG tablet Take 1 tablet (20 mg total) by mouth daily. 04/08/19  Yes Martinique, Betty G, MD  mupirocin ointment (BACTROBAN) 2 % Apply 1 application topically 2 (two) times daily. 02/04/20   [provider]    Allergies    Patient has no known allergies.  Review of Systems   Review of Systems  Constitutional: Negative for chills and fever.  HENT: Negative for ear pain and sore throat.   Eyes: Negative for pain and visual disturbance.  Respiratory: Negative for cough and shortness of breath.   Cardiovascular: Negative for chest pain and palpitations.  Gastrointestinal: Negative for abdominal pain and vomiting.  Genitourinary: Negative for dysuria and hematuria.  Musculoskeletal: Negative for arthralgias and back pain.  Skin: Negative for color change and rash.  Neurological: Positive for syncope. Negative for seizures.  All other systems reviewed and are negative.   Physical Exam Updated Vital Signs BP (!) 150/85   Pulse 70   Temp 98 F (36.7 C) (Oral)   Resp 17   Ht 5\' 11"  (1.803 m)   Wt 79.4 kg   SpO2 100%   BMI 24.41 kg/m   Physical Exam Vitals and nursing note reviewed.  Constitutional:      Appearance: He is  well-developed and well-nourished.  HENT:     Head: Normocephalic.     Comments: 3cm left frontal scalp laceration, 3cm laceration over left eye brow Eyes:     Conjunctiva/sclera: Conjunctivae normal.  Cardiovascular:     Rate and Rhythm: Normal rate and regular rhythm.     Heart sounds: No murmur heard.   Pulmonary:     Effort: Pulmonary effort is normal. No respiratory distress.     Breath sounds: Normal breath sounds.  Abdominal:     Palpations: Abdomen is soft.     Tenderness: There is no abdominal tenderness.  Musculoskeletal:        General: No deformity, signs of injury or edema.     Cervical back: Neck supple.  Skin:    General: Skin is warm and dry.  Neurological:      General: No focal deficit present.     Mental Status: He is alert.  Psychiatric:        Mood and Affect: Mood and affect and mood normal.        Behavior: Behavior normal.     ED Results / Procedures / Treatments   Labs (all labs ordered are listed, but only abnormal results are displayed) Labs Reviewed  CBC WITH DIFFERENTIAL/PLATELET - Abnormal; Notable for the following components:      Result Value   WBC 11.7 (*)    Neutro Abs 9.9 (*)    All other components within normal limits  BASIC METABOLIC PANEL - Abnormal; Notable for the following components:   Glucose, Bld 120 (*)    All other components within normal limits    EKG EKG Interpretation  Date/Time:  Thursday February 06 2020 18:36:11 EST Ventricular Rate:  76 PR Interval:    QRS Duration: 98 QT Interval:  419 QTC Calculation: 472 R Axis:   33 Text Interpretation: Sinus rhythm Confirmed by Pattricia Boss [phone]) on 02/07/2020 2:41:39 PM   Radiology DG Chest 2 View  Result Date: 02/06/2020 CLINICAL DATA:  Recent syncopal episode, initial encounter EXAM: CHEST - 2 VIEW COMPARISON:  03/07/2018 FINDINGS: Cardiac shadow is within normal limits. Aortic calcifications are seen. The lungs are hyper aerated bilaterally. No focal infiltrate or sizable effusion is seen. IMPRESSION: COPD without acute abnormality. Electronically Signed   By: Inez Catalina M.D.   On: 02/06/2020 18:05   CT Head Wo Contrast  Result Date: 02/06/2020 CLINICAL DATA:  Fall.  Head injury. EXAM: CT HEAD WITHOUT CONTRAST TECHNIQUE: Contiguous axial images were obtained from the base of the skull through the vertex without intravenous contrast. COMPARISON:  None. FINDINGS: Brain: Mild atrophy, appropriate for age. Negative for acute infarct, hemorrhage, mass. Vascular: Negative for hyperdense vessel Skull: Negative Sinuses/Orbits: Mild mucosal edema paranasal sinuses. Negative orbit. Other: Fracture of the nasal bone displaced to the right. Nasal septum  fracture. Large left supraorbital scalp laceration. IMPRESSION: 1. No acute intracranial abnormality 2. Nasal bone fracture displaced to the right. Fracture of the nasal septum. 3. Large supraorbital scalp laceration on the left. Electronically Signed   By: Franchot Gallo M.D.   On: 02/06/2020 18:21    Procedures .Marland KitchenLaceration Repair  Date/Time: 02/07/2020 3:16 PM Performed by: Lucrezia Starch, MD Authorized by: Lucrezia Starch, MD   Consent:    Consent obtained:  Verbal   Consent given by:  Patient   Risks, benefits, and alternatives were discussed: yes     Risks discussed:  Need for additional repair, infection, nerve damage, poor wound healing, poor cosmetic  result and pain   Alternatives discussed:  No treatment, delayed treatment, observation and referral Universal protocol:    Immediately prior to procedure, a time out was called: yes   Laceration details:    Location:  Face   Face location:  L eyebrow   Length (cm):  3 Treatment:    Area cleansed with:  Povidone-iodine and saline   Amount of cleaning:  Extensive   Irrigation solution:  Sterile saline   Irrigation method:  Syringe   Debridement:  None   Layers/structures repaired:  Deep subcutaneous Deep subcutaneous:    Suture size:  5-0   Suture material:  Vicryl   Suture technique:  Simple interrupted   Number of sutures:  2 Skin repair:    Repair method:  Sutures   Suture size:  5-0   Suture material:  Nylon   Suture technique:  Simple interrupted   Number of sutures:  3 Approximation:    Approximation:  Close Repair type:    Repair type:  Complex Post-procedure details:    Procedure completion:  Tolerated .Marland KitchenLaceration Repair  Date/Time: 02/07/2020 3:17 PM Performed by: Lucrezia Starch, MD Authorized by: Lucrezia Starch, MD   Consent:    Consent obtained:  Verbal   Consent given by:  Patient   Risks, benefits, and alternatives were discussed: yes     Risks discussed:  Infection, need for  additional repair, nerve damage and pain   Alternatives discussed:  No treatment Universal protocol:    Patient identity confirmed:  Verbally with patient Anesthesia:    Anesthesia method:  Local infiltration   Local anesthetic:  Lidocaine 2% WITH epi Laceration details:    Location:  Scalp   Scalp location:  Frontal   Length (cm):  3 Treatment:    Area cleansed with:  Povidone-iodine   Amount of cleaning:  Extensive   Irrigation solution:  Sterile water   Irrigation method:  Syringe   Debridement:  None   Layers/structures repaired:  Janae Sauce:    Suture size:  5-0   Suture technique:  Simple interrupted   Number of sutures:  1 Skin repair:    Repair method:  Sutures   Suture size:  5-0   Suture material:  Nylon   Number of sutures:  3 Approximation:    Approximation:  Close Repair type:    Repair type:  Complex Post-procedure details:    Procedure completion:  Tolerated Comments:     Irrigated both wounds with copious amounts of sterile saline as well as diluted Betadine solution.  Utilized 2% lidocaine with epinephrine for topical anesthetic.  First repaired the scalp laceration, after thorough irrigation, visualized small defect in galea, less than 1 cm, irrigated further, closed with 1 deep suture then closed skin.  Then turned attention to the laceration over his left eyebrow, and did not involve eyelid.  2 deep sutures placed followed by 3 superficial simple interrupted sutures to close skin.   (including critical care time)  Medications Ordered in ED Medications  lidocaine-EPINEPHrine (XYLOCAINE W/EPI) 2 %-1:100000 (with pres) injection 20 mL (20 mLs Infiltration Given by Other 02/06/20 2304)    ED Course  I have reviewed the triage vital signs and the nursing notes.  Pertinent labs & imaging results that were available during my care of the patient were reviewed by me and considered in my medical decision making (see chart for details).    MDM  Rules/Calculators/A&P  85 year old male with possible LOC in bathroom and head trauma.  CT head negative for any acute intracranial pathology, noted nasal bone fracture.  No other trauma on my assessment.  Repaired lacerations at bedside.  Basic labs within normal limits, no acute changes on EKG, no events on telemetry monitoring.  Stable for DC home.  Recommended follow-up with primary doctor for recheck regarding possible syncopal episode and suture removal in 1 week.  Recommended follow-up with ENT regarding the nasal fracture.    After the discussed management above, the patient was determined to be safe for discharge.  The patient was in agreement with this plan and all questions regarding their care were answered.  ED return precautions were discussed and the patient will return to the ED with any significant worsening of condition.   Final Clinical Impression(s) / ED Diagnoses Final diagnoses:  Facial laceration, initial encounter  Closed fracture of nasal bone, initial encounter    Rx / DC Orders ED Discharge Orders    None       Lucrezia Starch, MD 02/07/20 1521

## 2020-02-11 ENCOUNTER — Telehealth: Payer: Self-pay | Admitting: Family Medicine

## 2020-02-11 DIAGNOSIS — I10 Essential (primary) hypertension: Secondary | ICD-10-CM

## 2020-02-11 MED ORDER — OLMESARTAN MEDOXOMIL 20 MG PO TABS: 20.0000 mg | ORAL_TABLET | Freq: Every day | ORAL | 1 refills | Status: AC

## 2020-02-11 NOTE — Telephone Encounter (Signed)
Rx sent in

## 2020-02-11 NOTE — Telephone Encounter (Signed)
Pt is calling in stating that they are moving to [place] and would like to see if he can get his last refill until he can get a new provider in [place].  Rx's olmesartan (BENICAR) 20 MG  Pharm:  [facility] on [address]

## 2020-07-12 IMAGING — DX DG CHEST 2V
2 series · 2 of 2 positions shown · non-contrast
Comparison: None.

CLINICAL DATA: Productive cough.

EXAM:
CHEST - 2 VIEW

[chest pa]
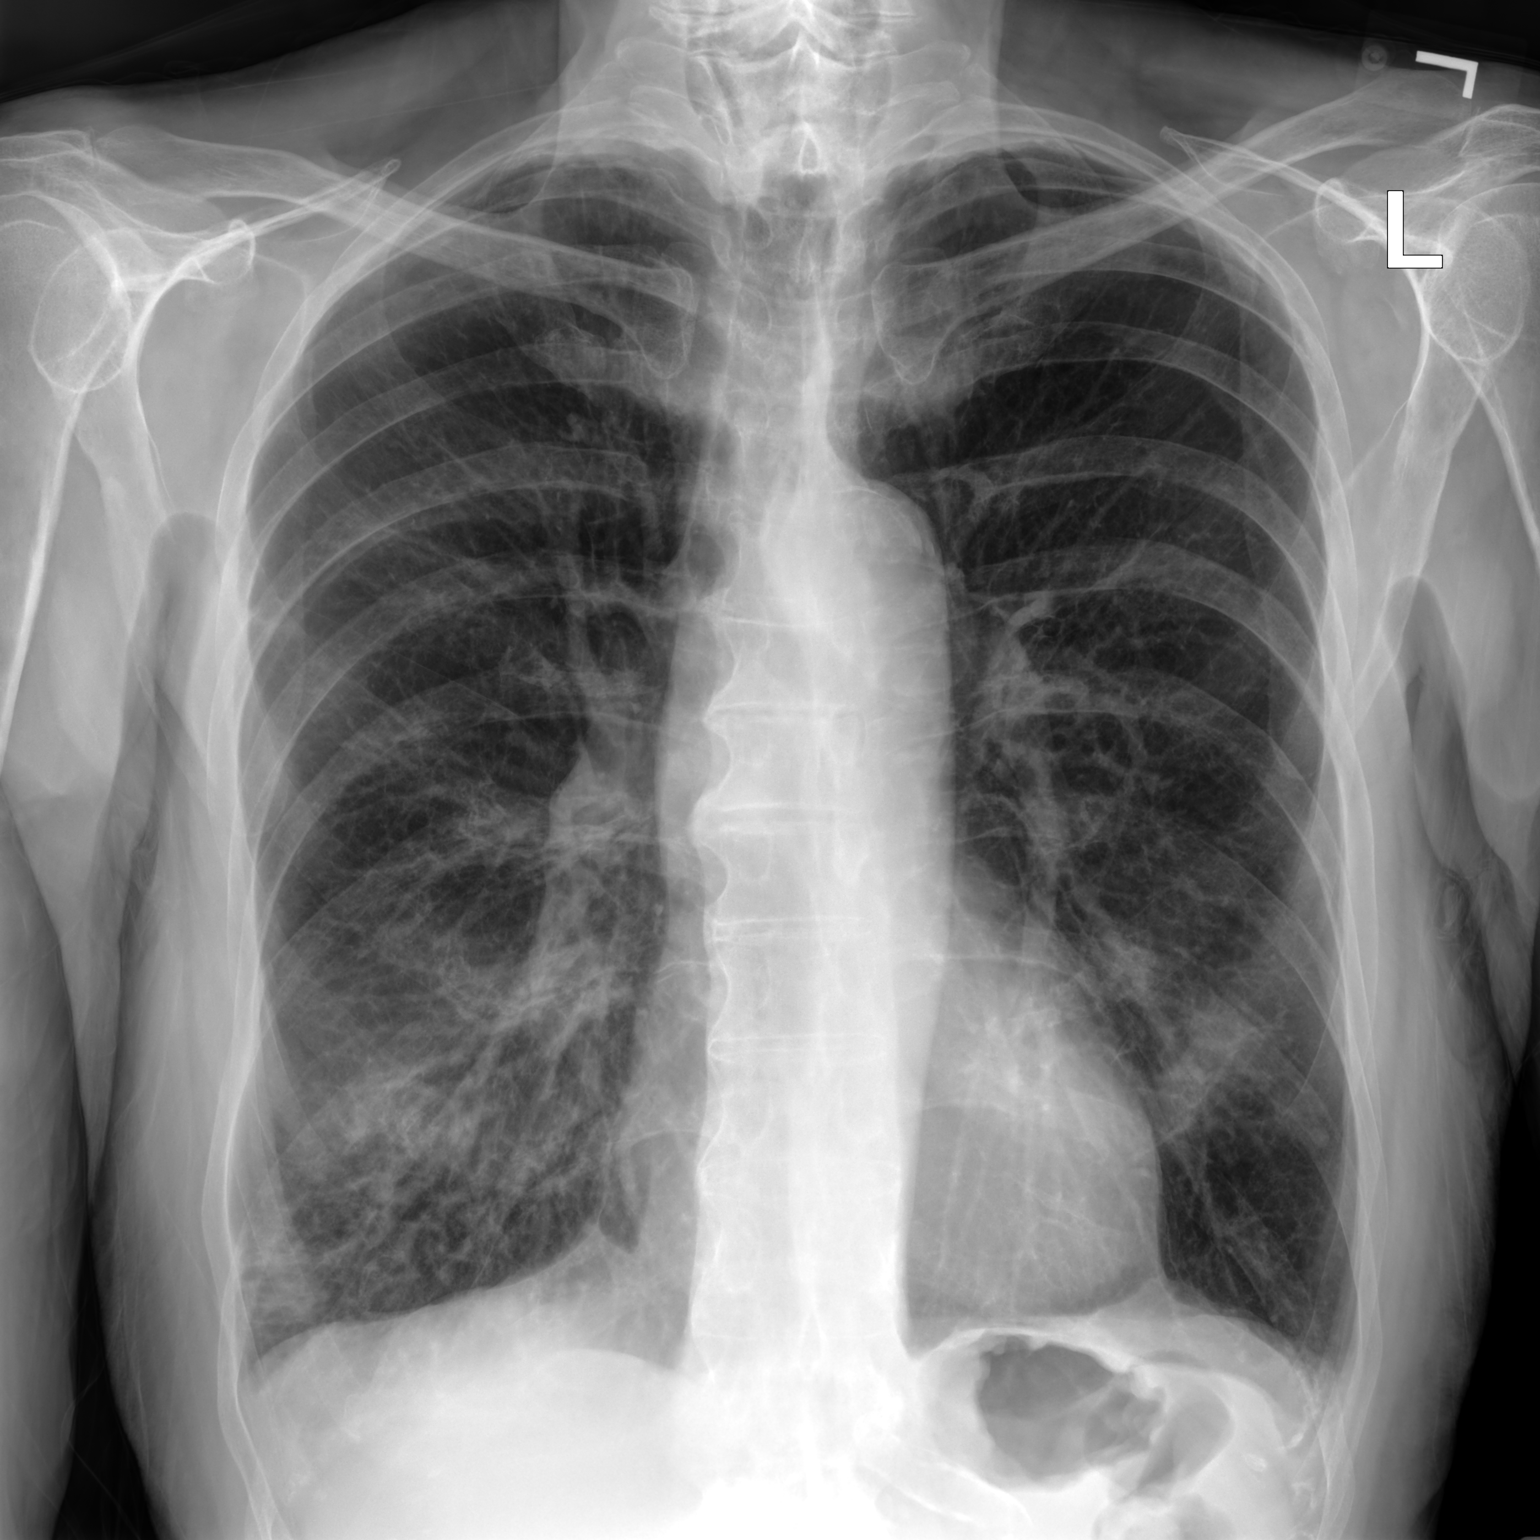

[chest lat]
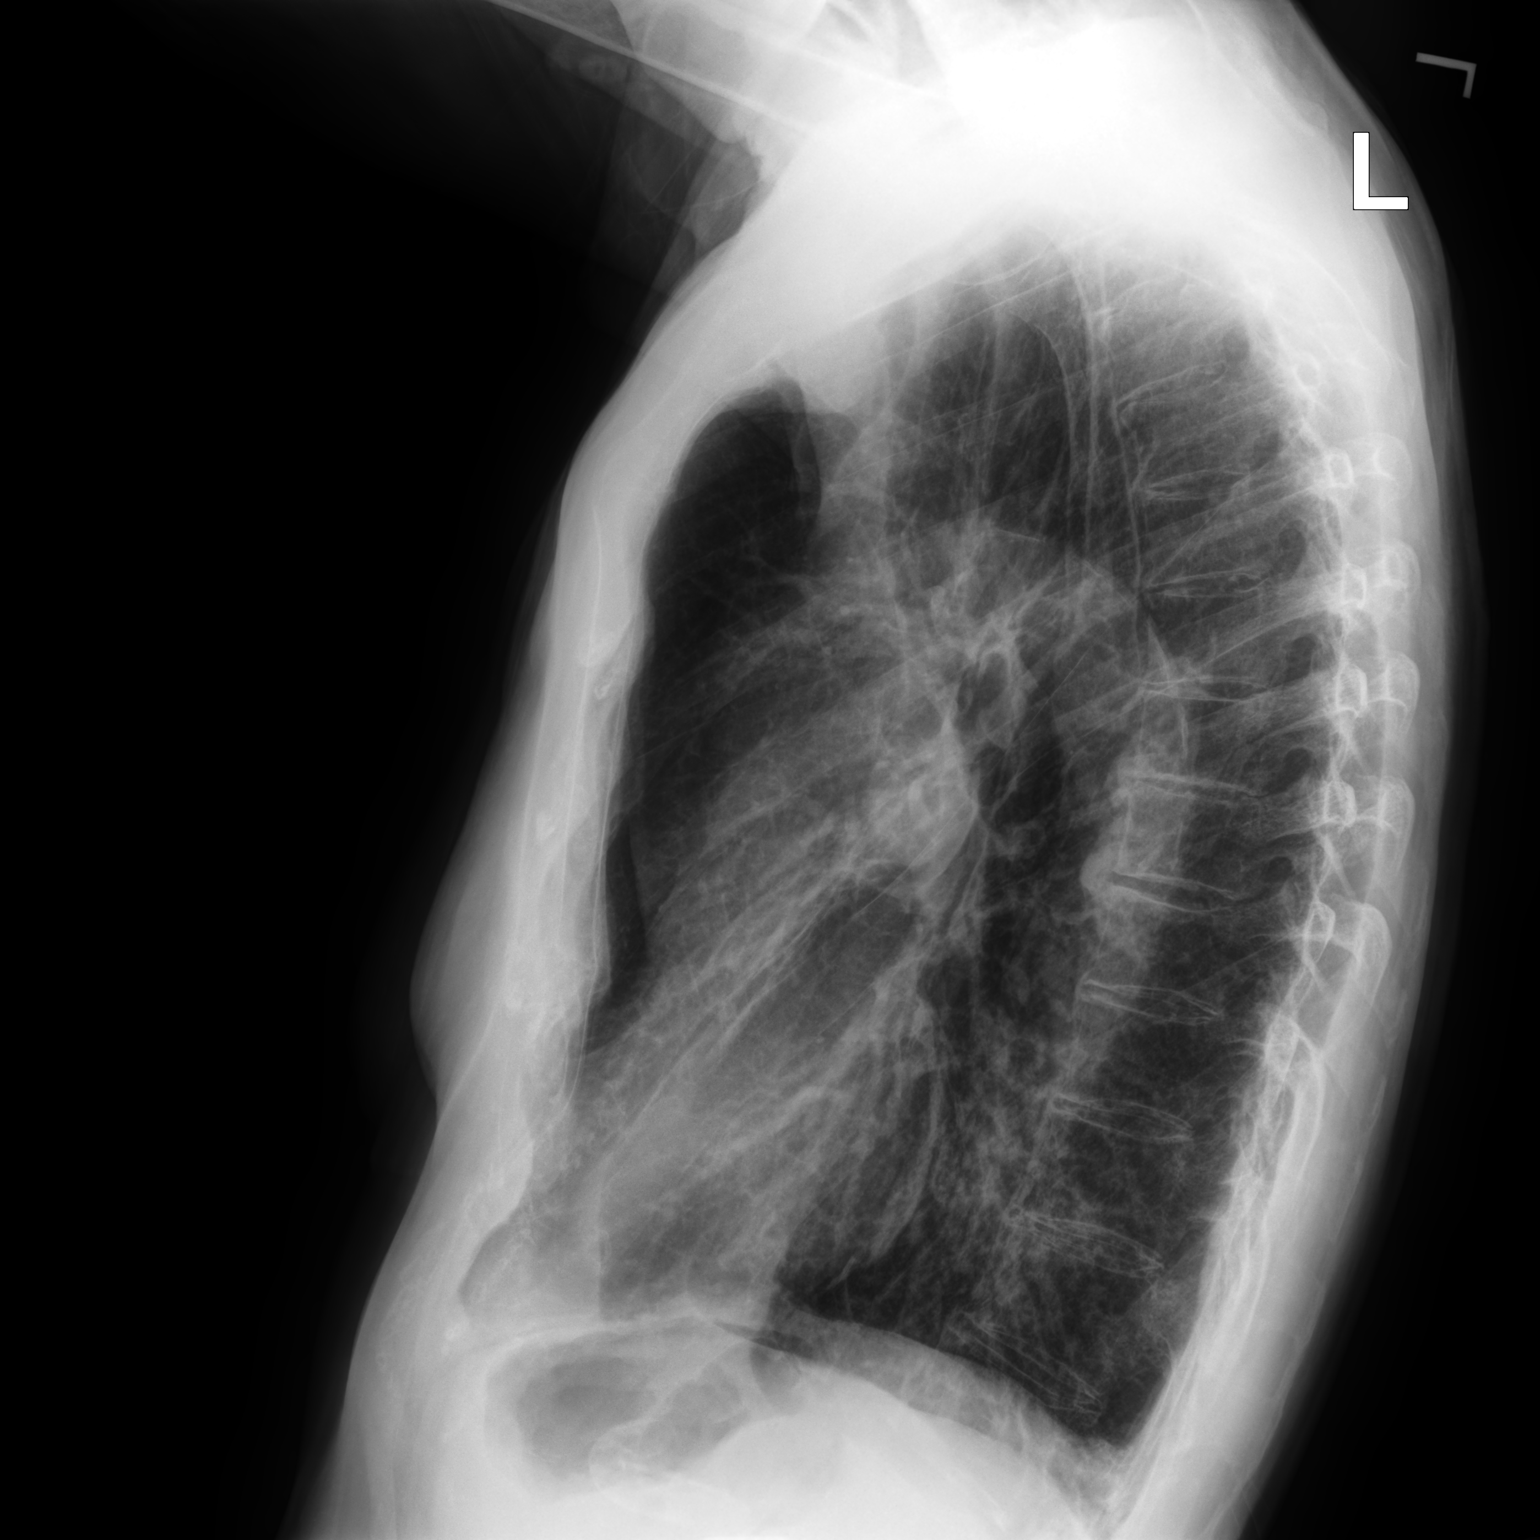

[2 of 2 positions shown; findings below may reference images not displayed]

FINDINGS: The heart size and mediastinal contours are within normal limits. No
pneumothorax or pleural effusion is noted. Left lung is clear. Mild
right basilar opacity is noted which may represent atelectasis or
infiltrate. The visualized skeletal structures are unremarkable.
IMPRESSION: Mild right basilar atelectasis or infiltrate is noted. Followup PA
and lateral chest X-ray is recommended in 3-4 weeks following trial
of antibiotic therapy to ensure resolution and exclude underlying
malignancy.
